# Patient Record
Sex: Female | Born: 1950 | Race: White | Hispanic: No | Marital: Married | Smoking: Former smoker
Health system: Southern US, Community
[De-identification: ages and names within clinical notes are randomized; demographics above are authoritative.]

## PROBLEM LIST (undated history)

## (undated) DIAGNOSIS — I10 Essential (primary) hypertension: Secondary | ICD-10-CM

## (undated) DIAGNOSIS — R002 Palpitations: Secondary | ICD-10-CM

## (undated) DIAGNOSIS — F419 Anxiety disorder, unspecified: Secondary | ICD-10-CM

## (undated) DIAGNOSIS — R0600 Dyspnea, unspecified: Secondary | ICD-10-CM

## (undated) DIAGNOSIS — R7303 Prediabetes: Secondary | ICD-10-CM

## (undated) DIAGNOSIS — R011 Cardiac murmur, unspecified: Secondary | ICD-10-CM

## (undated) DIAGNOSIS — T8859XA Other complications of anesthesia, initial encounter: Secondary | ICD-10-CM

## (undated) DIAGNOSIS — F32A Depression, unspecified: Secondary | ICD-10-CM

## (undated) DIAGNOSIS — E7800 Pure hypercholesterolemia, unspecified: Secondary | ICD-10-CM

## (undated) DIAGNOSIS — M1990 Unspecified osteoarthritis, unspecified site: Secondary | ICD-10-CM

## (undated) HISTORY — PX: KNEE ARTHROSCOPY: SUR90

## (undated) HISTORY — PX: CATARACT EXTRACTION: SUR2

## (undated) HISTORY — PX: ABLATION: SHX5711

---

## 1999-07-06 ENCOUNTER — Other Ambulatory Visit: Admission: RE | Admit: 1999-07-06 | Discharge: 1999-07-06 | Payer: Self-pay | Admitting: *Deleted

## 1999-08-12 ENCOUNTER — Encounter (INDEPENDENT_AMBULATORY_CARE_PROVIDER_SITE_OTHER): Payer: Self-pay | Admitting: Specialist

## 1999-08-12 ENCOUNTER — Other Ambulatory Visit: Admission: RE | Admit: 1999-08-12 | Discharge: 1999-08-12 | Payer: Self-pay | Admitting: *Deleted

## 2000-09-07 ENCOUNTER — Encounter: Payer: Self-pay | Admitting: Emergency Medicine

## 2000-09-07 ENCOUNTER — Emergency Department (HOSPITAL_COMMUNITY): Admission: EM | Admit: 2000-09-07 | Discharge: 2000-09-07 | Payer: Self-pay | Admitting: Emergency Medicine

## 2003-11-10 ENCOUNTER — Other Ambulatory Visit: Admission: RE | Admit: 2003-11-10 | Discharge: 2003-11-10 | Payer: Self-pay | Admitting: *Deleted

## 2004-12-09 ENCOUNTER — Other Ambulatory Visit: Admission: RE | Admit: 2004-12-09 | Discharge: 2004-12-09 | Payer: Self-pay | Admitting: Family Medicine

## 2005-08-28 ENCOUNTER — Emergency Department (HOSPITAL_COMMUNITY): Admission: EM | Admit: 2005-08-28 | Discharge: 2005-08-28 | Payer: Self-pay | Admitting: Emergency Medicine

## 2005-08-29 ENCOUNTER — Other Ambulatory Visit (HOSPITAL_COMMUNITY): Admission: RE | Admit: 2005-08-29 | Discharge: 2005-09-04 | Payer: Self-pay | Admitting: Psychiatry

## 2005-08-29 ENCOUNTER — Ambulatory Visit: Payer: Self-pay | Admitting: Psychiatry

## 2005-10-04 ENCOUNTER — Ambulatory Visit (HOSPITAL_COMMUNITY): Payer: Self-pay | Admitting: Psychiatry

## 2007-02-16 IMAGING — CT CT HEAD W/O CM
1 series · 16 of 30 positions shown, 20 images · IV contrast (agent unspecified)
Comparison: None.

CLINICAL DATA: Headache, depression and blurred vision. 
 HEAD CT WITHOUT CONTRAST:
TECHNIQUE: Contiguous axial images were obtained from the base of the skull through the vertex according to standard protocol without contrast.

[Series 2: head_seq 4.5 h45s st · axial · 0.43mm/px · z∈[-143,-17]mm · 16 of 32 slices shown, 20 images]
[im 2/32  brain]
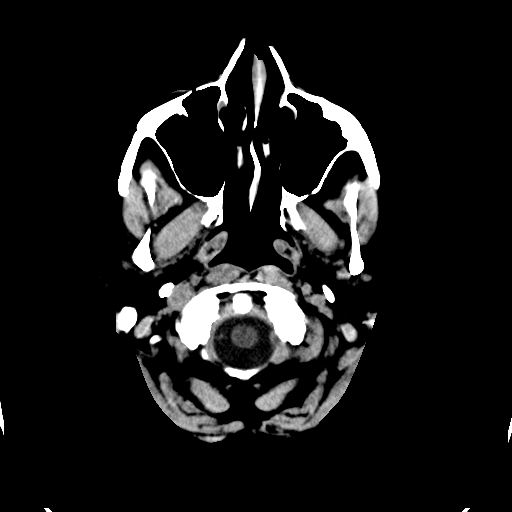
[im 2/32  bone]
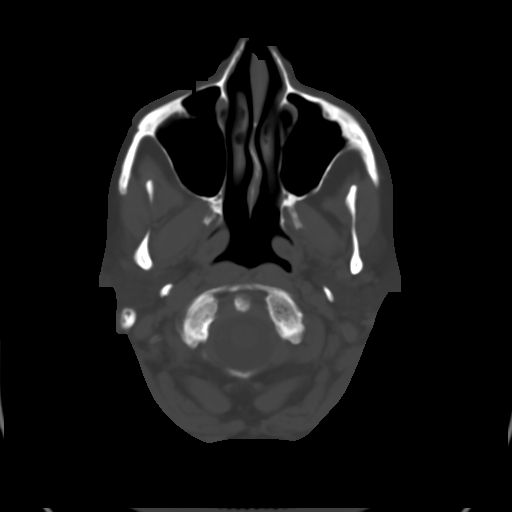
[im 4/32  brain]
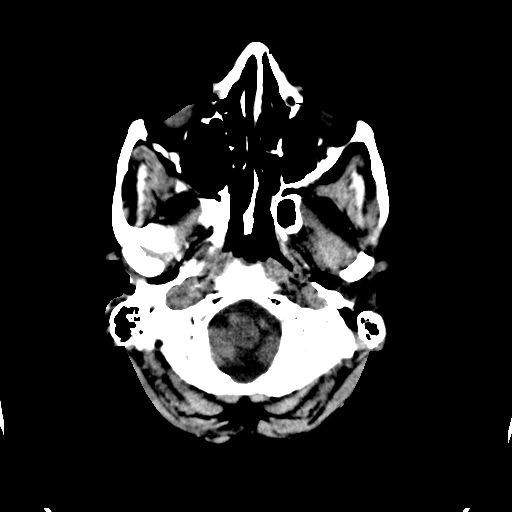
[im 6/32  brain]
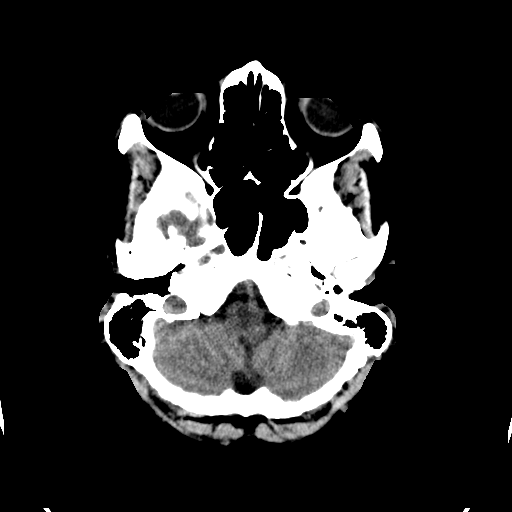
[im 8/32  brain]
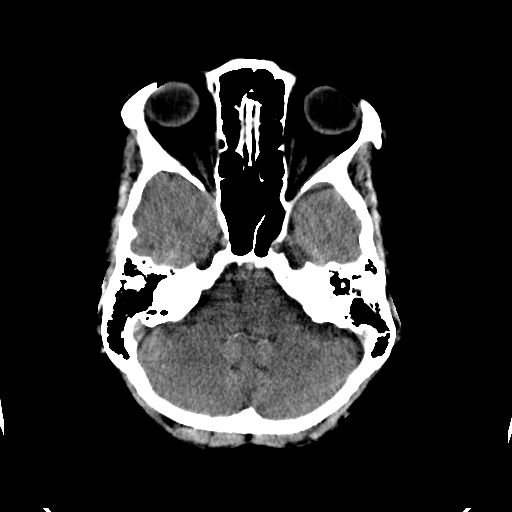
[im 9/32  brain]
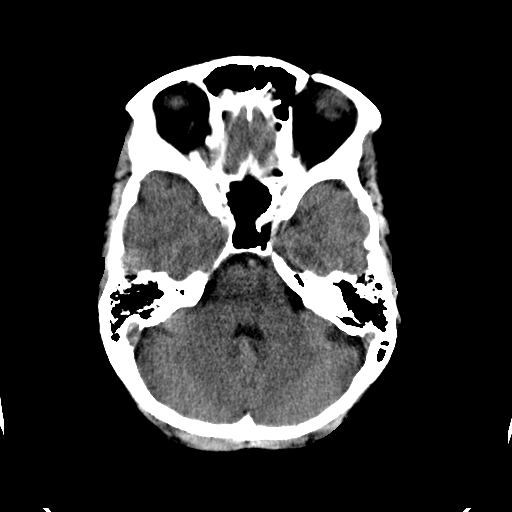
[im 9/32  bone]
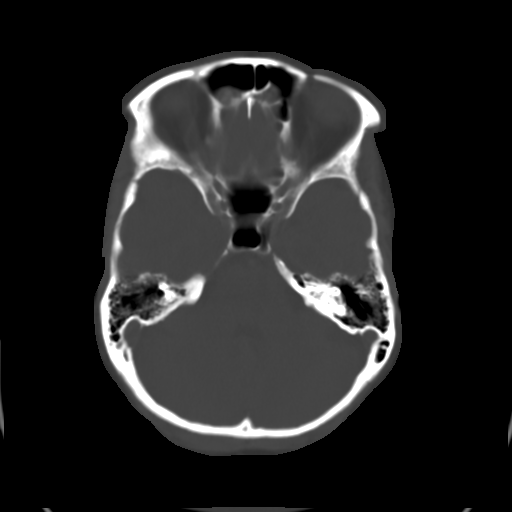
[im 11/32  brain]
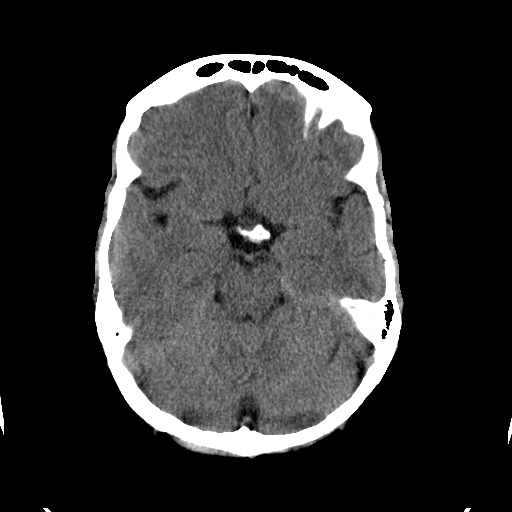
[im 13/32  brain]
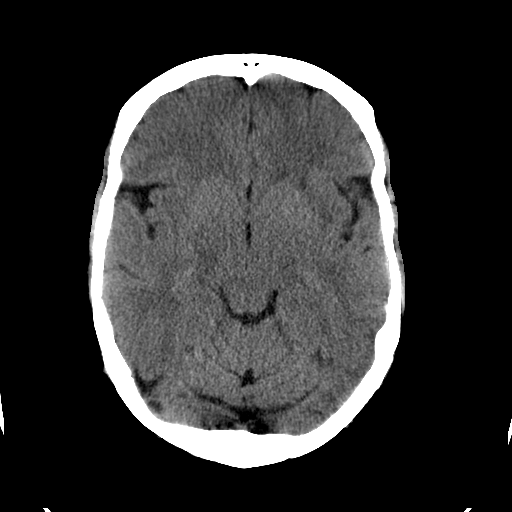
[im 15/32  brain]
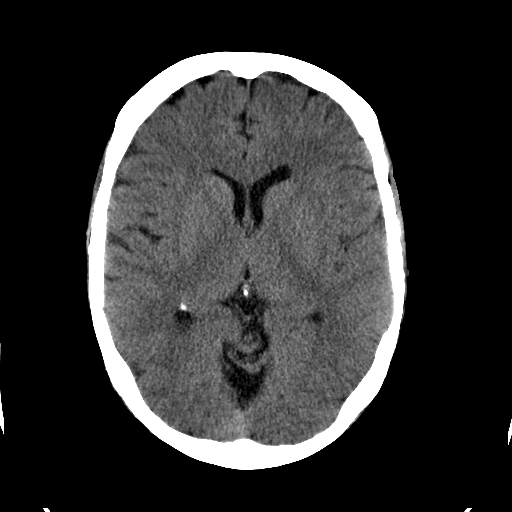
[im 17/32  brain]
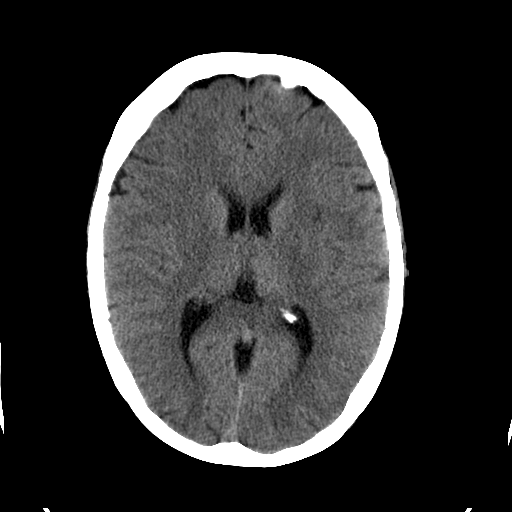
[im 17/32  bone]
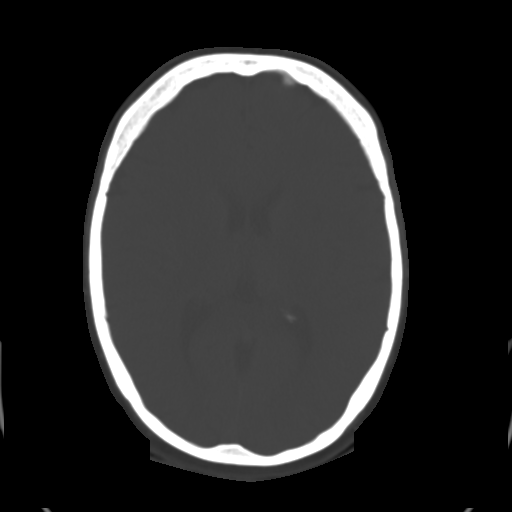
[im 19/32  brain]
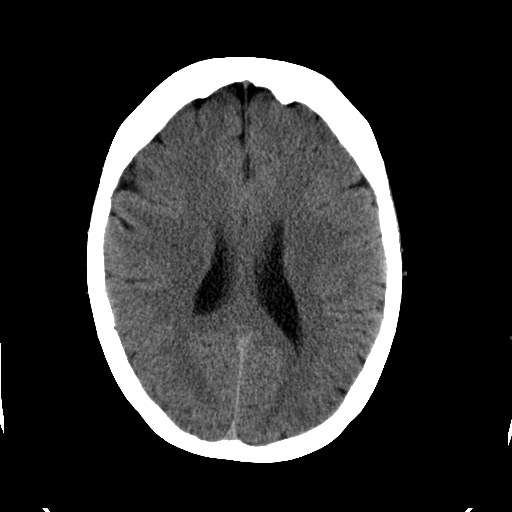
[im 21/32  brain]
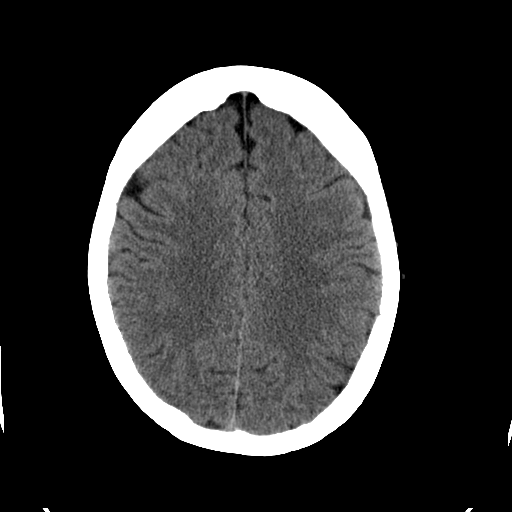
[im 23/32  brain]
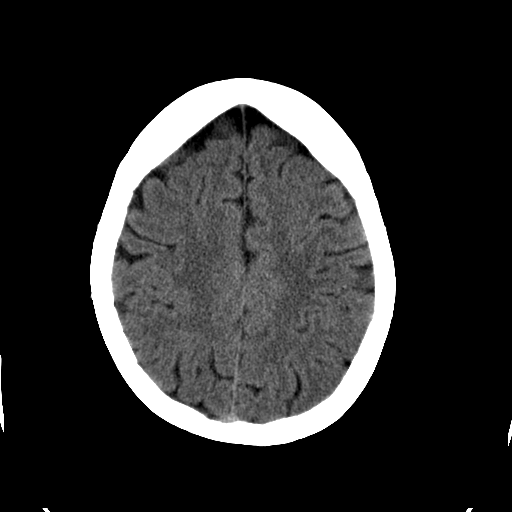
[im 24/32  brain]
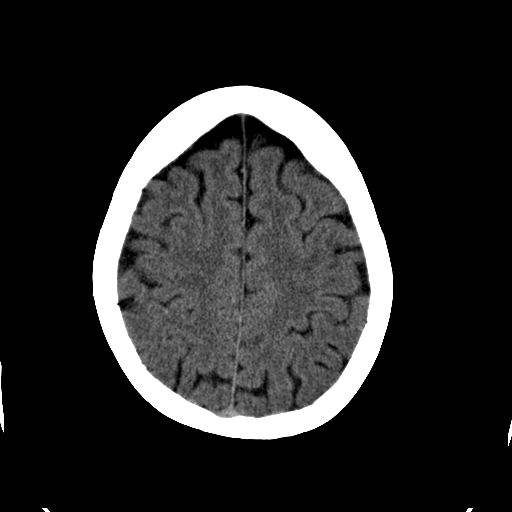
[im 24/32  bone]
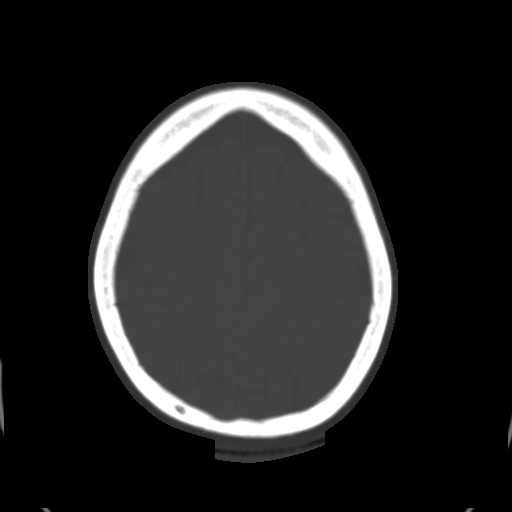
[im 26/32  brain]
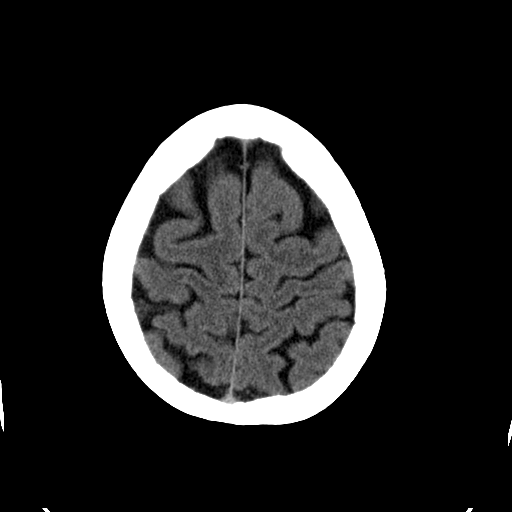
[im 28/32  brain]
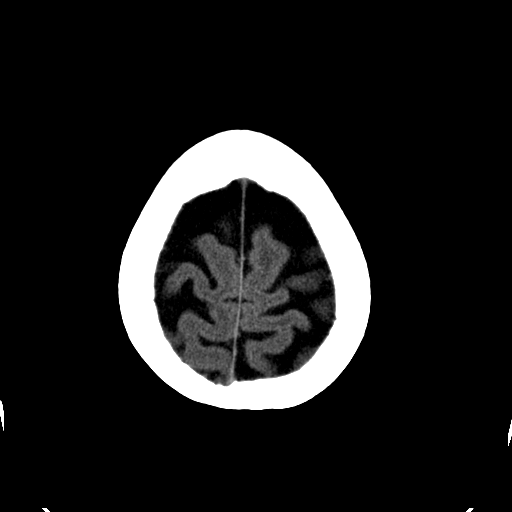
[im 30/32  brain]
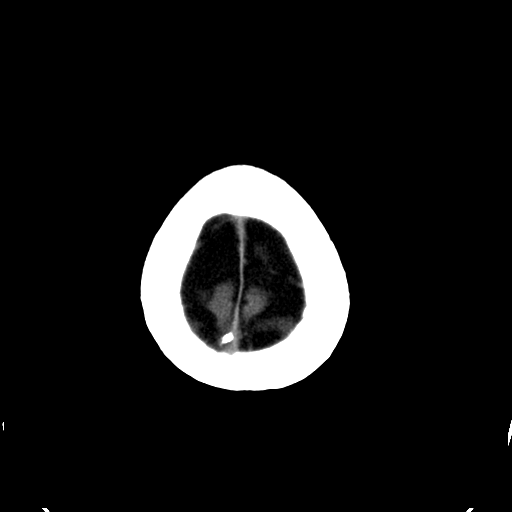

[16 of 30 positions shown; findings below may reference images not displayed]

FINDINGS: There is no evidence of acute intracranial hemorrhage, mass effect or extra-axial fluid collection.   The ventricles and subarachnoid spaces are appropriately sized for age.   The visualized paranasal sinuses are clear.  Calvarium is intact.
IMPRESSION: Unremarkable, unenhanced CT of the head.   No acute intracranial findings are seen. If the patient has persistent unexplained findings, further evaluation with MR should be considered.

## 2007-11-22 ENCOUNTER — Ambulatory Visit: Payer: Self-pay

## 2007-11-27 ENCOUNTER — Encounter: Admission: RE | Admit: 2007-11-27 | Discharge: 2007-11-27 | Payer: Self-pay | Admitting: Family Medicine

## 2009-11-04 ENCOUNTER — Encounter: Admission: RE | Admit: 2009-11-04 | Discharge: 2009-11-04 | Payer: Self-pay | Admitting: Family Medicine

## 2010-04-08 ENCOUNTER — Encounter: Admission: RE | Admit: 2010-04-08 | Discharge: 2010-04-08 | Payer: Self-pay | Admitting: Family Medicine

## 2013-05-21 ENCOUNTER — Observation Stay (HOSPITAL_COMMUNITY)
Admission: EM | Admit: 2013-05-21 | Discharge: 2013-05-22 | Disposition: A | Discharge status: Home / Self Care | Payer: BC Managed Care – PPO | Attending: Internal Medicine | Admitting: Internal Medicine

## 2013-05-21 ENCOUNTER — Encounter (HOSPITAL_COMMUNITY): Payer: Self-pay | Admitting: Emergency Medicine

## 2013-05-21 ENCOUNTER — Emergency Department (HOSPITAL_COMMUNITY): Payer: BC Managed Care – PPO

## 2013-05-21 DIAGNOSIS — R9431 Abnormal electrocardiogram [ECG] [EKG]: Secondary | ICD-10-CM | POA: Diagnosis present

## 2013-05-21 DIAGNOSIS — Z87891 Personal history of nicotine dependence: Secondary | ICD-10-CM | POA: Insufficient documentation

## 2013-05-21 DIAGNOSIS — R42 Dizziness and giddiness: Secondary | ICD-10-CM | POA: Insufficient documentation

## 2013-05-21 DIAGNOSIS — E876 Hypokalemia: Secondary | ICD-10-CM

## 2013-05-21 DIAGNOSIS — E7800 Pure hypercholesterolemia, unspecified: Secondary | ICD-10-CM | POA: Insufficient documentation

## 2013-05-21 DIAGNOSIS — R110 Nausea: Secondary | ICD-10-CM | POA: Insufficient documentation

## 2013-05-21 DIAGNOSIS — E860 Dehydration: Secondary | ICD-10-CM

## 2013-05-21 DIAGNOSIS — I10 Essential (primary) hypertension: Secondary | ICD-10-CM | POA: Diagnosis present

## 2013-05-21 DIAGNOSIS — R55 Syncope and collapse: Principal | ICD-10-CM | POA: Diagnosis present

## 2013-05-21 HISTORY — DX: Prediabetes: R73.03

## 2013-05-21 HISTORY — DX: Essential (primary) hypertension: I10

## 2013-05-21 HISTORY — DX: Pure hypercholesterolemia, unspecified: E78.00

## 2013-05-21 LAB — URINALYSIS, ROUTINE W REFLEX MICROSCOPIC
Bilirubin Urine: NEGATIVE
Ketones, ur: NEGATIVE mg/dL
Leukocytes, UA: NEGATIVE
Nitrite: NEGATIVE
Protein, ur: NEGATIVE mg/dL
Specific Gravity, Urine: 1.012 (ref 1.005–1.030)
Urobilinogen, UA: 0.2 mg/dL (ref 0.0–1.0)
pH: 6 (ref 5.0–8.0)

## 2013-05-21 LAB — COMPREHENSIVE METABOLIC PANEL
AST: 22 U/L (ref 0–37)
Albumin: 4.1 g/dL (ref 3.5–5.2)
Alkaline Phosphatase: 79 U/L (ref 39–117)
BUN: 16 mg/dL (ref 6–23)
CO2: 26 mEq/L (ref 19–32)
Creatinine, Ser: 0.79 mg/dL (ref 0.50–1.10)
GFR calc Af Amer: >90 mL/min (ref >90)
Potassium: 3.3 mEq/L — ABNORMAL LOW (ref 3.5–5.1)
Total Bilirubin: 0.3 mg/dL (ref 0.3–1.2)
Total Protein: 7.1 g/dL (ref 6.0–8.3)

## 2013-05-21 LAB — CBC WITH DIFFERENTIAL/PLATELET
Basophils Absolute: 0 10*3/uL (ref 0.0–0.1)
Basophils Relative: 0 % (ref 0–1)
Eosinophils Absolute: 0.2 10*3/uL (ref 0.0–0.7)
HCT: 39.1 % (ref 36.0–46.0)
Hemoglobin: 13.3 g/dL (ref 12.0–15.0)
Lymphocytes Relative: 15 % (ref 12–46)
MCH: 33 pg (ref 26.0–34.0)
MCHC: 34 g/dL (ref 30.0–36.0)
Monocytes Absolute: 0.9 10*3/uL (ref 0.1–1.0)
Monocytes Relative: 7 % (ref 3–12)
Neutro Abs: 9.5 10*3/uL — ABNORMAL HIGH (ref 1.7–7.7)
Neutrophils Relative %: 77 % (ref 43–77)
Platelets: 191 10*3/uL (ref 150–400)
RDW: 14 % (ref 11.5–15.5)

## 2013-05-21 LAB — GLUCOSE, CAPILLARY

## 2013-05-21 LAB — URINE MICROSCOPIC-ADD ON

## 2013-05-21 LAB — TROPONIN I: Troponin I: <0.3 ng/mL (ref <0.30)

## 2013-05-21 MED ORDER — LEVOTHYROXINE SODIUM 75 MCG PO TABS
75.0000 ug | ORAL_TABLET | Freq: Every day | ORAL | Status: DC
  Administered 2013-05-22: 75 ug via ORAL
  Filled 2013-05-21 (×2): qty 1

## 2013-05-21 MED ORDER — ENOXAPARIN SODIUM 40 MG/0.4ML ~~LOC~~ SOLN
40.0000 mg | SUBCUTANEOUS | Status: DC
  Administered 2013-05-21: 40 mg via SUBCUTANEOUS
  Filled 2013-05-21 (×2): qty 0.4

## 2013-05-21 MED ORDER — ALLOPURINOL 300 MG PO TABS
300.0000 mg | ORAL_TABLET | Freq: Every day | ORAL | Status: DC
  Administered 2013-05-22: 300 mg via ORAL
  Filled 2013-05-21: qty 1

## 2013-05-21 MED ORDER — SODIUM CHLORIDE 0.9 % IJ SOLN: 3.0000 mL | Freq: Two times a day (BID) | INTRAMUSCULAR | Status: DC

## 2013-05-21 MED ORDER — SODIUM CHLORIDE 0.9 % IV BOLUS (SEPSIS)
1000.0000 mL | Freq: Once | INTRAVENOUS | Status: AC
  Administered 2013-05-21: 1000 mL via INTRAVENOUS

## 2013-05-21 MED ORDER — ONDANSETRON HCL 4 MG/2ML IJ SOLN: 4.0000 mg | Freq: Three times a day (TID) | INTRAMUSCULAR | Status: AC | PRN

## 2013-05-21 MED ORDER — FLUOXETINE HCL 20 MG PO TABS
20.0000 mg | ORAL_TABLET | Freq: Every day | ORAL | Status: DC
  Administered 2013-05-22: 20 mg via ORAL
  Filled 2013-05-21: qty 1

## 2013-05-21 MED ORDER — SERTRALINE HCL 100 MG PO TABS
100.0000 mg | ORAL_TABLET | Freq: Every day | ORAL | Status: DC
  Administered 2013-05-22: 100 mg via ORAL
  Filled 2013-05-21: qty 1

## 2013-05-21 MED ORDER — HYDRALAZINE HCL 20 MG/ML IJ SOLN: 10.0000 mg | INTRAMUSCULAR | Status: DC | PRN

## 2013-05-21 MED ORDER — ATORVASTATIN CALCIUM 40 MG PO TABS
40.0000 mg | ORAL_TABLET | Freq: Every day | ORAL | Status: DC
  Administered 2013-05-22: 40 mg via ORAL
  Filled 2013-05-21: qty 1

## 2013-05-21 MED ORDER — NITROGLYCERIN 2 % TD OINT
0.5000 [in_us] | TOPICAL_OINTMENT | Freq: Four times a day (QID) | TRANSDERMAL | Status: DC
  Filled 2013-05-21 (×8): qty 30

## 2013-05-21 MED ORDER — ALPRAZOLAM 0.25 MG PO TABS
0.2500 mg | ORAL_TABLET | Freq: Every day | ORAL | Status: DC
  Administered 2013-05-21: 0.25 mg via ORAL
  Filled 2013-05-21: qty 1

## 2013-05-21 MED ORDER — NABUMETONE 750 MG PO TABS
750.0000 mg | ORAL_TABLET | Freq: Two times a day (BID) | ORAL | Status: DC
  Administered 2013-05-21 – 2013-05-22 (×2): 750 mg via ORAL
  Filled 2013-05-21 (×3): qty 1

## 2013-05-21 MED ORDER — SODIUM CHLORIDE 0.9 % IV SOLN
INTRAVENOUS | Status: AC
  Administered 2013-05-21: 22:00:00 via INTRAVENOUS

## 2013-05-21 NOTE — H&P (Signed)
[facility] History and Physical  Patient: Christina Villegas  NWG:[id number]  DOB: [birth year]  DOA: 05/21/2013  Referring physician: Dr. Manus Gunning PCP: No primary provider on file.  Consults:     Chief Complaint: Near syncope  HPI: Christina Villegas is a 62 y.o. female with Past medical history of hypertension. She presented today with 2 episodes of near-syncope. She mentions that she has been working they are throughout the day today when she went in the house to get some water she felt lightheaded and nausea to had to sit down. She said that she was going pass out but denies any loss of consciousness. EMS was called by which time the patient was awake with some jumbled speech. After that when EMS was bringing the patient to the hospital she had another episode of similar lightheaded and dizziness with near syncope. Patient denies any complaint of fever, chest pain, shortness of breath, headache, focal neurological deficit or diarrhea, vomiting, abdominal pain. She mentions she is compliant with all her medications and has been taking both Lasix and hydrochlorothiazide.  Review of Systems: as mentioned in the history of present illness.  A Comprehensive review of the other systems is negative.  Past Medical History  Diagnosis Date  . Hypertension   . High cholesterol   . Pre-diabetes    Past Surgical History  Procedure Laterality Date  . Knee arthroscopy      rt side   Social History:  reports that she has quit smoking. She does not have any smokeless tobacco history on file. She reports that she does not drink alcohol or use illicit drugs. Patient is coming from home. Independent for most of her  ADL.  Allergies  Allergen Reactions  . Hydrocodone Nausea Only    Family History  Problem Relation Age of Onset  . Heart failure Mother   . Hypertension Mother   . Heart failure Father   . Hypertension Father   . Heart failure Brother     Prior to Admission medications    Medication Sig Start Date End Date Taking? Authorizing Provider  allopurinol (ZYLOPRIM) 300 MG tablet Take 300 mg by mouth daily.   Yes Historical Provider, MD  ALPRAZolam (XANAX) 0.25 MG tablet Take 0.25 mg by mouth every evening.    Yes Historical Provider, MD  FLUoxetine (PROZAC) 20 MG tablet Take 20 mg by mouth daily.   Yes Historical Provider, MD  furosemide (LASIX) 20 MG tablet Take 20 mg by mouth daily.   Yes Historical Provider, MD  hydrochlorothiazide (HYDRODIURIL) 25 MG tablet Take 25 mg by mouth daily.   Yes Historical Provider, MD  levothyroxine (SYNTHROID, LEVOTHROID) 75 MCG tablet Take 75 mcg by mouth daily before breakfast.   Yes Historical Provider, MD  nabumetone (RELAFEN) 750 MG tablet Take 750 mg by mouth 2 (two) times daily.   Yes Historical Provider, MD  sertraline (ZOLOFT) 100 MG tablet Take 100 mg by mouth every morning.   Yes Historical Provider, MD  simvastatin (ZOCOR) 80 MG tablet Take 80 mg by mouth daily.   Yes Historical Provider, MD  Vitamin D, Ergocalciferol, (DRISDOL) 50000 UNITS CAPS capsule Take 50,000 Units by mouth every 7 (seven) days. On mondays   Yes Historical Provider, MD    Physical Exam: Filed Vitals:   05/21/13 1755 05/21/13 1815 05/21/13 2000 05/21/13 2103  BP: 169/84 178/88 170/72 168/68  Pulse: 88 86 97   Temp:      TempSrc:      Resp:  20 20  18  SpO2:  100% 98% 98%    General: Alert, Awake and Oriented to Time, Place and Person. Appear in mild distress Eyes: PERRL ENT: Oral Mucosa clear moist. Neck: no JVD, no Carotid Bruits  Cardiovascular: S1 and S2 Present, no Murmur, Peripheral Pulses Present Respiratory: Bilateral Air entry equal and Decreased, Clear to Auscultation,  no Crackles,no wheezes Abdomen: Bowel Sound Present, Soft and Non tender Skin: no Rash Extremities: no Pedal edema, no calf tenderness Neurologic: Grossly Unremarkable.  Labs on Admission:  CBC:  Recent Labs Lab 05/21/13 1920  WBC 12.4*  NEUTROABS 9.5*   HGB 13.3  HCT 39.1  MCV 97.0  PLT 191    CMP     Component Value Date/Time   NA 138 05/21/2013 1920   K 3.3* 05/21/2013 1920   CL 100 05/21/2013 1920   CO2 26 05/21/2013 1920   GLUCOSE 108* 05/21/2013 1920   BUN 16 05/21/2013 1920   CREATININE 0.79 05/21/2013 1920   CALCIUM 9.8 05/21/2013 1920   PROT 7.1 05/21/2013 1920   ALBUMIN 4.1 05/21/2013 1920   AST 22 05/21/2013 1920   ALT 16 05/21/2013 1920   ALKPHOS 79 05/21/2013 1920   BILITOT 0.3 05/21/2013 1920   GFRNONAA 87* 05/21/2013 1920   GFRAA >90 05/21/2013 1920    No results found for this basename: LIPASE, AMYLASE,  in the last 168 hours No results found for this basename: AMMONIA,  in the last 168 hours  Cardiac Enzymes:  Recent Labs Lab 05/21/13 1920  TROPONINI <0.30    BNP (last 3 results) No results found for this basename: PROBNP,  in the last 8760 hours  Radiological Exams on Admission: Dg Chest 2 View  05/21/2013   CLINICAL DATA:  Syncope  EXAM: CHEST  2 VIEW  COMPARISON:  Prior radiograph from 05/10/2012  FINDINGS: There is mild accentuation of the transverse heart size, likely secondary to the AP technique. Mediastinal silhouette is unchanged.  Lungs are normally inflated. No focal infiltrate to suggest acute infectious pneumonitis is identified. There is no pulmonary edema or pleural effusion. No pneumothorax.  Multilevel degenerative osteophytosis noted within the visualized spine. No acute osseous abnormality.  IMPRESSION: No active cardiopulmonary disease.   Electronically Signed   By: Rise Mu M.D.   On: 05/21/2013 18:49    EKG: Independently reviewed. normal sinus rhythm, nonspecific ST and T waves changes.  Assessment/Plan Principal Problem:   Syncope Active Problems:   EKG abnormalities   Hypertension   1. Syncope The patient had poor oral intake and then had done yard work during the day after which she felt lightheaded. She has been taking diuretics 2 of them consistently. Possible  etiologies include dehydration versus vasovagal. With the recurrent symptoms patient will be admitted for observation. Telemetry and echocardiogram will be obtained.  2. Inferolateral EKG changes The patient's initial EKG has T wave inversions in lead 3 and aVF. Although we do not have any prior EKG to compare changes. Her initial troponin is negative and she does not have any chest pain. We'll follow serial troponins.  3. Hypertension At present we will hold off her Lasix and hydrochlorothiazide   DVT Prophylaxis: subcutaneous Heparin Nutrition: Cardiac diet  Code Status: Full  Family Communication: Husband was present at bedside, opportunity was given to the family to ask question and all questions were answered satisfactorily at the time of interview. Disposition: Admitted to observation in telemetry.  Author: Lynden Oxford, MD [facility] Pager: [phone] 05/21/2013, 9:09 PM  If 7PM-7AM, please contact night-coverage [email or web address] Password [email or web address]

## 2013-05-21 NOTE — ED Provider Notes (Signed)
CSN: [id number]     Arrival date & time 05/21/13  1715 History   First MD Initiated Contact with Patient 05/21/13 1719     Chief Complaint  Patient presents with  . Loss of Consciousness   (Consider location/radiation/quality/duration/timing/severity/associated sxs/prior Treatment) HPI Comments: Syncopal episode after working in the yard today. EMS reports patient began to feel lightheaded, nauseated after she finished working in the yard and had to sit down on a lawnmower. She then had blurry vision and "everything went white" and she loss consciousness. On EMS arrival patient another syncopal episode when she was sat up and had jumbled speech for about 10 seconds and back to normal. Patient is to poor by mouth intake since this morning. Significant it was nonexertional. No chest pain or shortness of breath. Denies any previous history of cardiac problems. Did not hit head.  The history is provided by the EMS personnel and the patient.    Past Medical History  Diagnosis Date  . Hypertension   . High cholesterol   . Pre-diabetes    Past Surgical History  Procedure Laterality Date  . Knee arthroscopy      rt side   Family History  Problem Relation Age of Onset  . Heart failure Mother   . Hypertension Mother   . Heart failure Father   . Hypertension Father   . Heart failure Brother    History  Substance Use Topics  . Smoking status: Former Games developer  . Smokeless tobacco: Not on file  . Alcohol Use: No   OB History   Grav Para Term Preterm Abortions TAB SAB Ect Mult Living                 Review of Systems  Constitutional: Negative for fever, activity change, appetite change and fatigue.  Respiratory: Negative for cough, chest tightness and shortness of breath.   Cardiovascular: Positive for syncope. Negative for chest pain.  Gastrointestinal: Negative for nausea, vomiting and abdominal pain.  Genitourinary: Negative for dysuria and hematuria.  Musculoskeletal: Negative for  arthralgias and back pain.  Skin: Negative for rash.  Neurological: Positive for dizziness, syncope and light-headedness. Negative for headaches.  A complete 10 system review of systems was obtained and all systems are negative except as noted in the HPI and PMH.    Allergies  Hydrocodone  Home Medications  No current outpatient prescriptions on file. BP 146/51  Pulse 91  Temp(Src) 98.1 F (36.7 C) (Oral)  Resp 16  Ht 5\' 3"  (1.6 m)  Wt 210 lb 12.8 oz (95.618 kg)  BMI 37.35 kg/m2  SpO2 98% Physical Exam  Constitutional: She is oriented to person, place, and time. She appears well-developed and well-nourished. No distress.  Flat affect  HENT:  Head: Normocephalic and atraumatic.  Mouth/Throat: Oropharynx is clear and moist. No oropharyngeal exudate.  Eyes: Conjunctivae and EOM are normal. Pupils are equal, round, and reactive to light.  Neck: Normal range of motion. Neck supple.  Cardiovascular: Normal rate, regular rhythm and normal heart sounds.   No murmur heard. Pulmonary/Chest: Effort normal and breath sounds normal. No respiratory distress.  Abdominal: Soft. There is no tenderness. There is no rebound and no guarding.  Musculoskeletal: Normal range of motion. She exhibits no edema and no tenderness.  Neurological: She is alert and oriented to person, place, and time. No cranial nerve deficit. She exhibits normal muscle tone. Coordination normal.  CN 2-12 intact, no ataxia on finger to nose, no nystagmus, 5/5 strength throughout, no  pronator drift, Romberg negative, normal gait.   Skin: Skin is warm.    ED Course  Procedures (including critical care time) Labs Review Labs Reviewed  CBC WITH DIFFERENTIAL - Abnormal; Notable for the following:    WBC 12.4 (*)    Neutro Abs 9.5 (*)    All other components within normal limits  COMPREHENSIVE METABOLIC PANEL - Abnormal; Notable for the following:    Potassium 3.3 (*)    Glucose, Bld 108 (*)    GFR calc non Af Amer 87  (*)    All other components within normal limits  URINALYSIS, ROUTINE W REFLEX MICROSCOPIC - Abnormal; Notable for the following:    Hgb urine dipstick SMALL (*)    All other components within normal limits  GLUCOSE, CAPILLARY - Abnormal; Notable for the following:    Glucose-Capillary 106 (*)    All other components within normal limits  URINE MICROSCOPIC-ADD ON - Abnormal; Notable for the following:    Bacteria, UA FEW (*)    Casts GRANULAR CAST (*)    All other components within normal limits  TROPONIN I  TROPONIN I  TROPONIN I  TROPONIN I  COMPREHENSIVE METABOLIC PANEL  CBC  PROTIME-INR   Imaging Review Dg Chest 2 View  05/21/2013   CLINICAL DATA:  Syncope  EXAM: CHEST  2 VIEW  COMPARISON:  Prior radiograph from 05/10/2012  FINDINGS: There is mild accentuation of the transverse heart size, likely secondary to the AP technique. Mediastinal silhouette is unchanged.  Lungs are normally inflated. No focal infiltrate to suggest acute infectious pneumonitis is identified. There is no pulmonary edema or pleural effusion. No pneumothorax.  Multilevel degenerative osteophytosis noted within the visualized spine. No acute osseous abnormality.  IMPRESSION: No active cardiopulmonary disease.   Electronically Signed   By: Rise Mu M.D.   On: 05/21/2013 18:49    EKG Interpretation     Ventricular Rate:  85 PR Interval:  167 QRS Duration: 91 QT Interval:  378 QTC Calculation: 449 R Axis:   41 Text Interpretation:  Sinus rhythm Biatrial enlargement Inferior infarct, age indeterminate Lateral leads are also involved inferolateral ST depressions,  No previous ECGs available            MDM   1. Syncope   2. EKG abnormality    Syncopal episode preceded by lightheadedness, nausea, blurry vision.  No CP or SOB.  Feels back to baseline now.  Poor po intake today.  Syncope nonexertional.  No murmurs.  Syncope sounds vasovagal.  Some ST depressions on EKG, no comparison.   Labs remarkable for leukocytosis.  No evidence of infection.  Given EKG abnormalities, will monitor overnight and workup syncope.     Glynn Octave, MD 05/21/13 [phone]

## 2013-05-21 NOTE — ED Notes (Addendum)
Patient transported to X-ray 

## 2013-05-21 NOTE — ED Notes (Addendum)
Per EMS: Pt picked up from home. Reported pt was working in yard today. LOC. Pt reports she "blacked out." EMS arrived pt A&Ox4 then suddenly "blacked out" again for 10-15secs, laid back and came to and spoke with "jumbled" words lasting 10 secs then back to norm. Not much to eat, crackers and Gatorade. H/o syncopal episode after a period of "laughing hard." VSS, NAD.
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
## 2013-05-22 DIAGNOSIS — E860 Dehydration: Secondary | ICD-10-CM

## 2013-05-22 DIAGNOSIS — I10 Essential (primary) hypertension: Secondary | ICD-10-CM

## 2013-05-22 DIAGNOSIS — E876 Hypokalemia: Secondary | ICD-10-CM

## 2013-05-22 LAB — COMPREHENSIVE METABOLIC PANEL
ALT: 14 U/L (ref 0–35)
AST: 17 U/L (ref 0–37)
Albumin: 3.5 g/dL (ref 3.5–5.2)
BUN: 15 mg/dL (ref 6–23)
CO2: 29 mEq/L (ref 19–32)
Calcium: 8.9 mg/dL (ref 8.4–10.5)
Chloride: 104 mEq/L (ref 96–112)
Glucose, Bld: 103 mg/dL — ABNORMAL HIGH (ref 70–99)
Potassium: 3.2 mEq/L — ABNORMAL LOW (ref 3.5–5.1)
Total Protein: 6 g/dL (ref 6.0–8.3)

## 2013-05-22 LAB — TROPONIN I
Troponin I: <0.3 ng/mL (ref <0.30)
Troponin I: <0.3 ng/mL (ref <0.30)

## 2013-05-22 LAB — PROTIME-INR: INR: 1.12 (ref 0.00–1.49)

## 2013-05-22 LAB — CBC
HCT: 36.8 % (ref 36.0–46.0)
Hemoglobin: 12.2 g/dL (ref 12.0–15.0)
MCH: 32.4 pg (ref 26.0–34.0)
MCHC: 33.2 g/dL (ref 30.0–36.0)
MCV: 97.9 fL (ref 78.0–100.0)
RBC: 3.76 MIL/uL — ABNORMAL LOW (ref 3.87–5.11)

## 2013-05-22 MED ORDER — POTASSIUM CHLORIDE CRYS ER 20 MEQ PO TBCR
40.0000 meq | EXTENDED_RELEASE_TABLET | Freq: Once | ORAL | Status: AC
  Administered 2013-05-22: 40 meq via ORAL
  Filled 2013-05-22: qty 2

## 2013-05-22 MED ORDER — LISINOPRIL 10 MG PO TABS: 10.0000 mg | ORAL_TABLET | Freq: Every day | ORAL | Status: DC

## 2013-05-22 NOTE — Progress Notes (Signed)
Pt provided with dc isntructions and education. Pt verbalized understanding. Pt has no questions at thist ime. Aware of medications that have been stopped. IV removed with tip intact. Heart monitor cleaned and returned to front. Levonne Spiller, RN

## 2013-05-22 NOTE — Progress Notes (Signed)
05-22-13 1222 MD spoke to CM in regards to assisting pt to get a BP cuff. Pt is unable to pay out of pocket due to husband is the only one working at this time. CM did call [facility] and they have an automatic cuff for 29.99. Case Manager did call [employer] to see if pt could get a BP cuff and pt was not enrolled in the CAD Program. [employer] will call pt to see if she qualifies for program tomorrow. CM did go to [facility] and spoke to [facility] and he provided CM 40.00 in cash to give to pt to get a BP cuff at [facility] on [address] Money was given to pt and family was very Adult nurse. CM did call [facility] to make them aware that pt has the money to get the cuff. No further needs from CM at this time. Gala Lewandowsky, RN,BSN [phone]

## 2013-05-22 NOTE — Progress Notes (Signed)
UR COMPLETED  

## 2013-05-22 NOTE — Discharge Summary (Signed)
Physician Discharge Summary  Christina Villegas ZOX:[id number] DOB: 10-Apr-1951 DOA: 05/21/2013  PCP: No primary provider on file.  Admit date: 05/21/2013 Discharge date: 05/22/2013  Time spent: 35 minutes  Recommendations for Outpatient Follow-up:  1. Please follow up on patient's blood pressures, stopping hydrochlorothiazide and Lasix do to dehydration. Started lisinopril 10 mg by mouth daily.  Discharge Diagnoses:  Principal Problem:   Syncope Active Problems:   EKG abnormalities   Hypertension   Discharge Condition: Stable/improved  Diet recommendation: Heart healthy diet  Filed Weights   05/21/13 2148  Weight: 95.618 kg (210 lb 12.8 oz)    History of present illness:  Christina Villegas is a 62 y.o. female with Past medical history of hypertension.  She presented today with 2 episodes of near-syncope. She mentions that she has been working they are throughout the day today when she went in the house to get some water she felt lightheaded and nausea to had to sit down. She said that she was going pass out but denies any loss of consciousness. EMS was called by which time the patient was awake with some jumbled speech. After that when EMS was bringing the patient to the hospital she had another episode of similar lightheaded and dizziness with near syncope.  Patient denies any complaint of fever, chest pain, shortness of breath, headache, focal neurological deficit or diarrhea, vomiting, abdominal pain.  She mentions she is compliant with all her medications and has been taking both Lasix and hydrochlorothiazide.   Hospital Course:  Patient is a pleasant 62 year old female with a past medical history of hypertension, who had been on Hydrochlorothiazide and Lasix. She reported working in her garden the day before admission, feeling dizzy and lightheaded. She reported 2 episodes of near syncope. She was placed on telemetry for overnight observation. Troponin remained negative x4 sets. Lab work  showed the presence of hypokalemia with a potassium of 3.2 for which she was given 40 mEq by mouth of potassium chloride. Symptoms likely secondary to hypovolemia/dehydration likely resulting from diuretic therapy. By the following day, she reported feeling much better, ambulated down the hallway and back. Orthostatics were checked which were negative. She was instructed to stop diuretics, starting lisinopril at 10 mg by mouth daily. She was also instructed to check her blood pressures daily, record them on a log book and present these to her primary care provider on hospital followup.  Discharge Exam: Filed Vitals:   05/22/13 0846  BP: 155/74  Pulse: 77  Temp: 97.5 F (36.4 C)  Resp: 18    General: No acute distress, reports feeling better today. No further episodes of syncope or near-syncope Cardiovascular: Regular rate and rhythm normal S1-S2 Respiratory: Lungs are clear to auscultation bilaterally no wheezing rhonchi or rales Abdomen: Soft nontender nondistended positive bowel sounds Extremities: No edema  Discharge Instructions  Discharge Orders   Future Orders Complete By Expires   Diet - low sodium heart healthy  As directed    Discharge instructions  As directed    Comments:     1) We are stopping your fluid pills and starting a new blood pressure medication. Please check your blood pressures once daily, record these values on a log book and show this to your family doctor on follow up. 2) Please follow up with your family doctor in 1 week   Increase activity slowly  As directed        Medication List    STOP taking these medications  furosemide 20 MG tablet  Commonly known as:  LASIX     hydrochlorothiazide 25 MG tablet  Commonly known as:  HYDRODIURIL      TAKE these medications       allopurinol 300 MG tablet  Commonly known as:  ZYLOPRIM  Take 300 mg by mouth daily.     ALPRAZolam 0.25 MG tablet  Commonly known as:  XANAX  Take 0.25 mg by mouth every  evening.     FLUoxetine 20 MG tablet  Commonly known as:  PROZAC  Take 20 mg by mouth daily.     levothyroxine 75 MCG tablet  Commonly known as:  SYNTHROID, LEVOTHROID  Take 75 mcg by mouth daily before breakfast.     lisinopril 10 MG tablet  Commonly known as:  PRINIVIL  Take 1 tablet (10 mg total) by mouth daily.     nabumetone 750 MG tablet  Commonly known as:  RELAFEN  Take 750 mg by mouth 2 (two) times daily.     sertraline 100 MG tablet  Commonly known as:  ZOLOFT  Take 100 mg by mouth every morning.     simvastatin 80 MG tablet  Commonly known as:  ZOCOR  Take 80 mg by mouth daily.     Vitamin D (Ergocalciferol) 50000 UNITS Caps capsule  Commonly known as:  DRISDOL  Take 50,000 Units by mouth every 7 (seven) days. On mondays       Allergies  Allergen Reactions  . Hydrocodone Nausea Only      The results of significant diagnostics from this hospitalization (including imaging, microbiology, ancillary and laboratory) are listed below for reference.    Significant Diagnostic Studies: Dg Chest 2 View  05/21/2013   CLINICAL DATA:  Syncope  EXAM: CHEST  2 VIEW  COMPARISON:  Prior radiograph from 05/10/2012  FINDINGS: There is mild accentuation of the transverse heart size, likely secondary to the AP technique. Mediastinal silhouette is unchanged.  Lungs are normally inflated. No focal infiltrate to suggest acute infectious pneumonitis is identified. There is no pulmonary edema or pleural effusion. No pneumothorax.  Multilevel degenerative osteophytosis noted within the visualized spine. No acute osseous abnormality.  IMPRESSION: No active cardiopulmonary disease.   Electronically Signed   By: Rise Mu M.D.   On: 05/21/2013 18:49    Microbiology: No results found for this or any previous visit (from the past 240 hour(s)).   Labs: Basic Metabolic Panel:  Recent Labs Lab 05/21/13 1920 05/22/13 0110  NA 138 140  K 3.3* 3.2*  CL 100 104  CO2 26 29   GLUCOSE 108* 103*  BUN 16 15  CREATININE 0.79 0.79  CALCIUM 9.8 8.9   Liver Function Tests:  Recent Labs Lab 05/21/13 1920 05/22/13 0110  AST 22 17  ALT 16 14  ALKPHOS 79 69  BILITOT 0.3 0.4  PROT 7.1 6.0  ALBUMIN 4.1 3.5   No results found for this basename: LIPASE, AMYLASE,  in the last 168 hours No results found for this basename: AMMONIA,  in the last 168 hours CBC:  Recent Labs Lab 05/21/13 1920 05/22/13 0110  WBC 12.4* 10.1  NEUTROABS 9.5*  --   HGB 13.3 12.2  HCT 39.1 36.8  MCV 97.0 97.9  PLT 191 182   Cardiac Enzymes:  Recent Labs Lab 05/21/13 1920 05/22/13 0110 05/22/13 0710 05/22/13 0935  TROPONINI <0.30 <0.30 <0.30 <0.30   BNP: BNP (last 3 results) No results found for this basename: PROBNP,  in the last  8760 hours CBG:  Recent Labs Lab 05/21/13 1753  GLUCAP 106*       Signed:  Misti Towle  [facility] 05/22/2013, 11:54 AM

## 2015-05-08 IMAGING — CR DG CHEST 2V
2 series · 2 of 2 positions shown · non-contrast
Comparison: Prior radiograph from 05/10/2012

CLINICAL DATA: Syncope

EXAM:
CHEST  2 VIEW

[w chest lat]
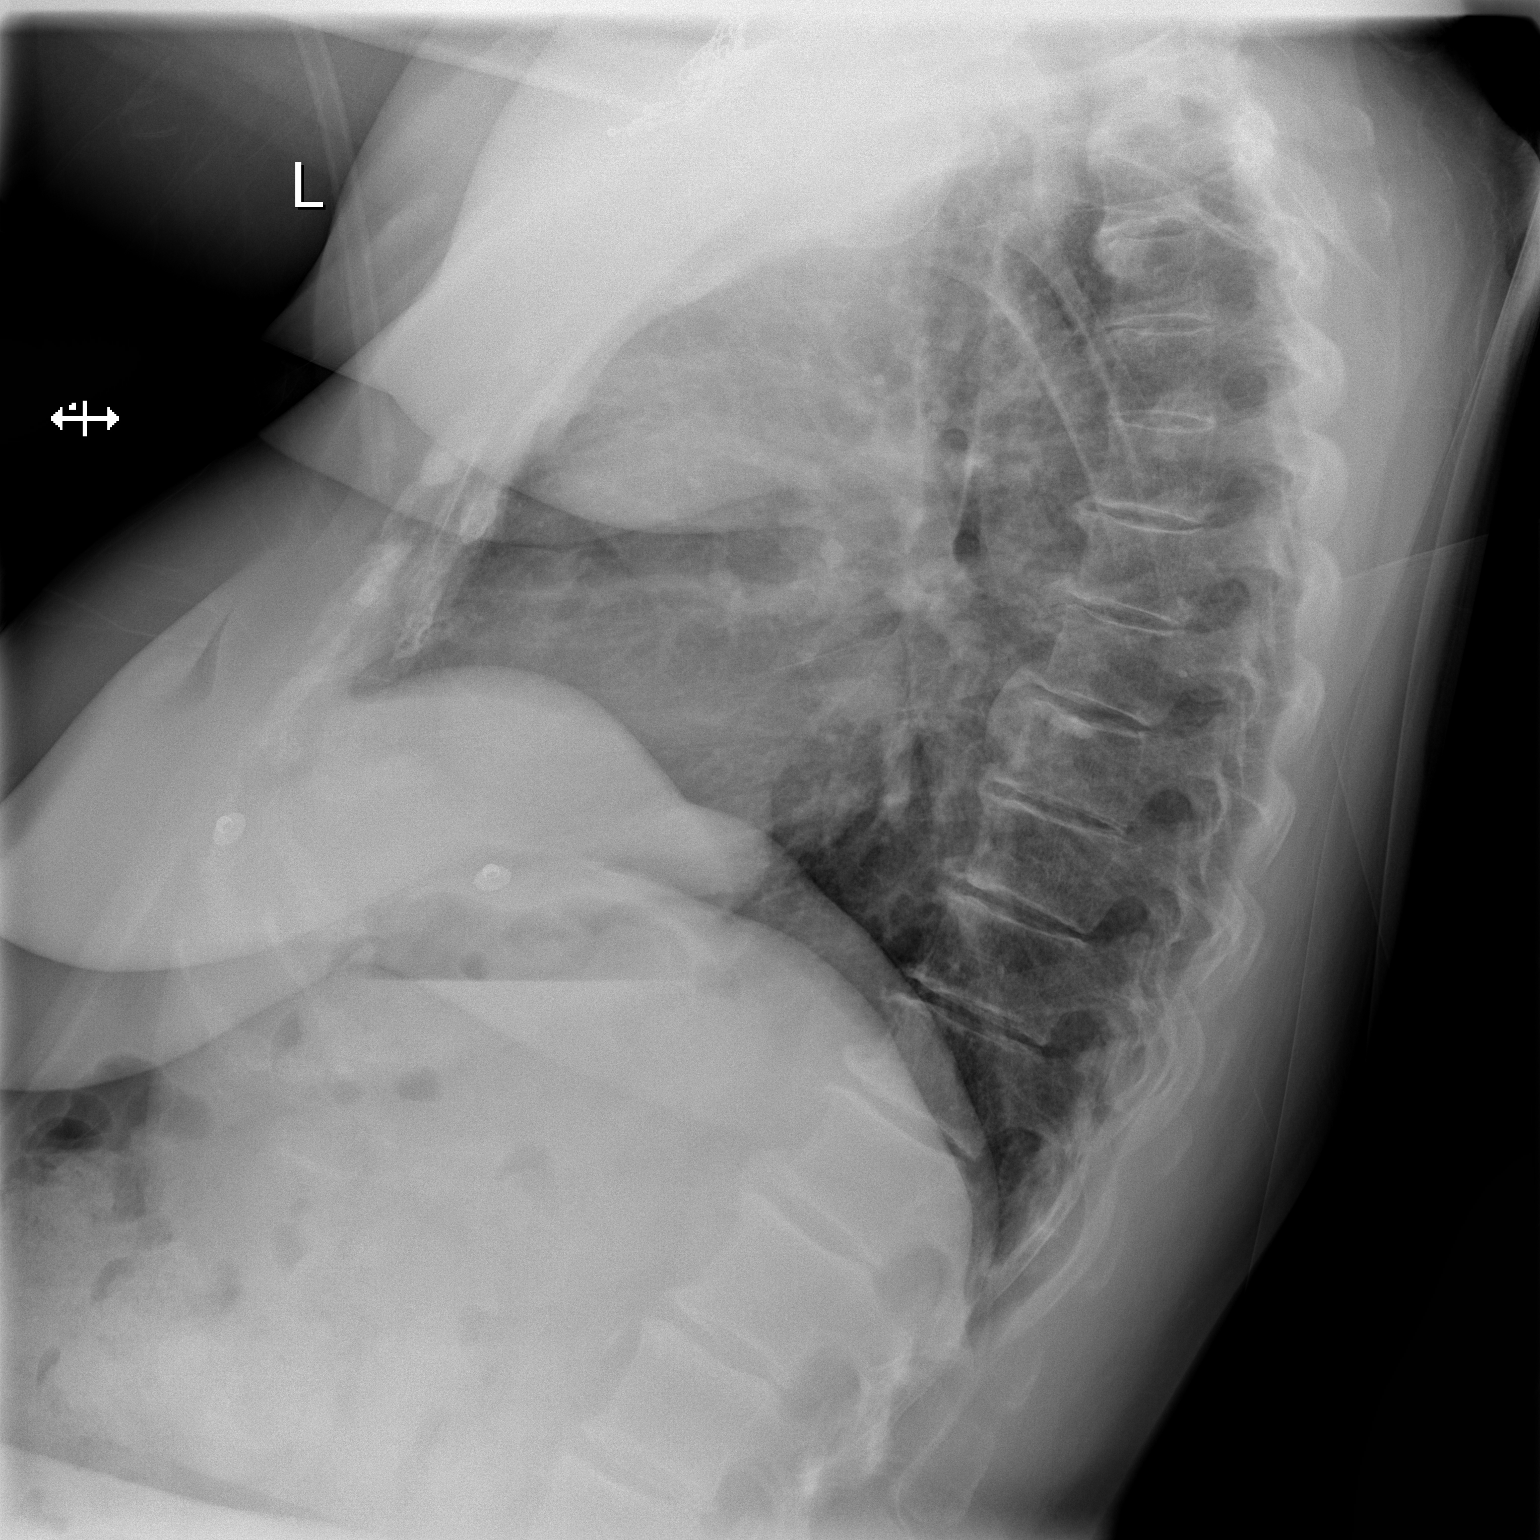

[x chest ap]
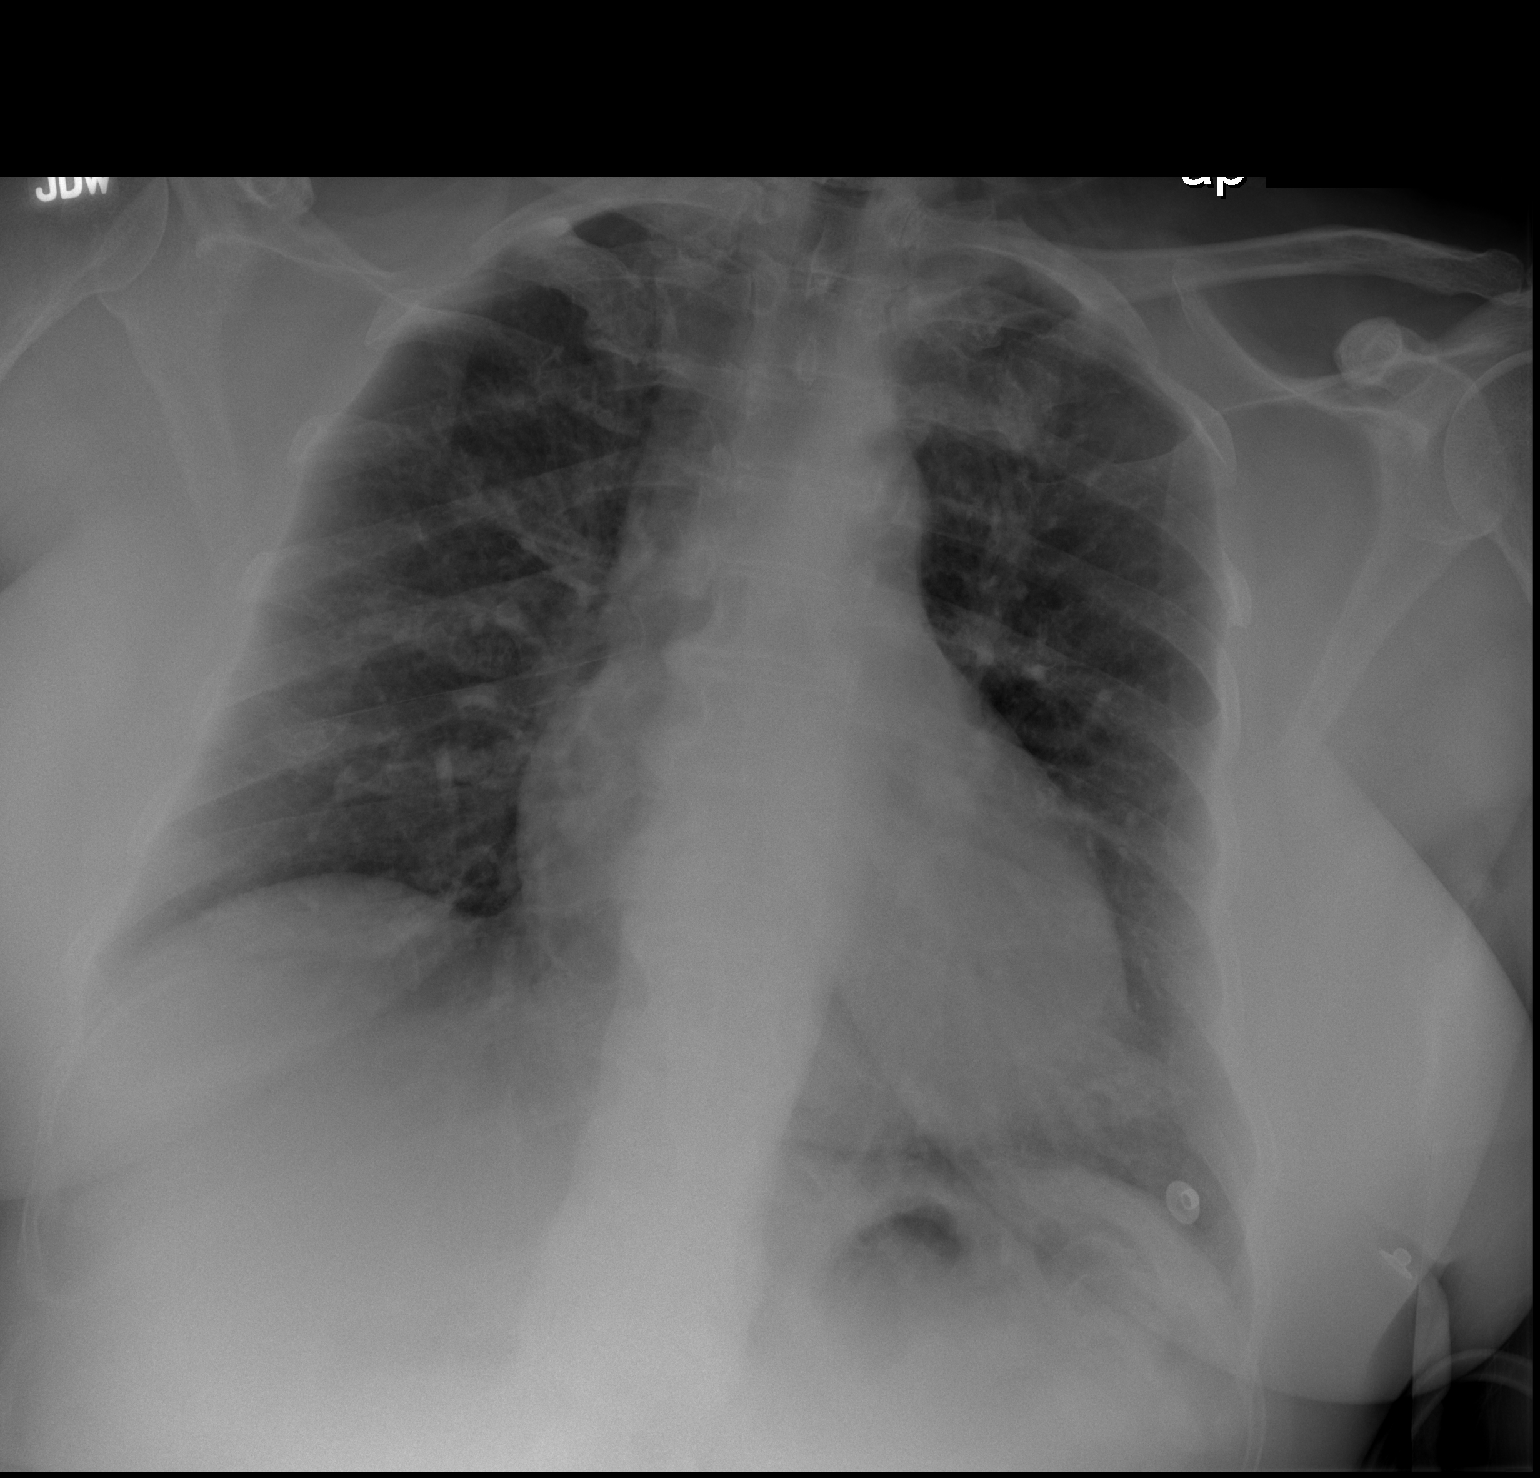

[2 of 2 positions shown; findings below may reference images not displayed]

FINDINGS: There is mild accentuation of the transverse heart size, likely
secondary to the AP technique. Mediastinal silhouette is unchanged.

Lungs are normally inflated. No focal infiltrate to suggest acute
infectious pneumonitis is identified. There is no pulmonary edema or
pleural effusion. No pneumothorax.

Multilevel degenerative osteophytosis noted within the visualized
spine. No acute osseous abnormality.
IMPRESSION: No active cardiopulmonary disease.

## 2020-05-24 ENCOUNTER — Telehealth: Payer: Self-pay | Admitting: Oncology

## 2020-05-24 NOTE — Telephone Encounter (Signed)
Scheduled appointments per 11/3 new patient referral. Attempted to call patient using both numbers provided. No answer on either number. Was unable to leave a voicemail. Will have updated calendar sent to patient with appointment date and time.

## 2020-05-25 ENCOUNTER — Telehealth: Payer: Self-pay | Admitting: Oncology

## 2020-05-25 NOTE — Telephone Encounter (Signed)
Patient and friend, Christina Villegas called to check when she is scheduled to see a Provider here.  Gave patient Appointment Information with Date, Time and Instructions

## 2020-05-31 ENCOUNTER — Other Ambulatory Visit: Payer: Self-pay

## 2020-05-31 ENCOUNTER — Inpatient Hospital Stay: Payer: Medicare HMO | Admitting: Oncology

## 2020-05-31 ENCOUNTER — Inpatient Hospital Stay: Payer: Medicare HMO | Attending: Oncology

## 2020-07-21 DIAGNOSIS — R0981 Nasal congestion: Secondary | ICD-10-CM | POA: Diagnosis not present

## 2020-07-21 DIAGNOSIS — R059 Cough, unspecified: Secondary | ICD-10-CM | POA: Diagnosis not present

## 2020-07-21 DIAGNOSIS — J3089 Other allergic rhinitis: Secondary | ICD-10-CM | POA: Diagnosis not present

## 2020-07-21 DIAGNOSIS — R519 Headache, unspecified: Secondary | ICD-10-CM | POA: Diagnosis not present

## 2020-07-21 DIAGNOSIS — Z03818 Encounter for observation for suspected exposure to other biological agents ruled out: Secondary | ICD-10-CM | POA: Diagnosis not present

## 2020-07-21 DIAGNOSIS — I10 Essential (primary) hypertension: Secondary | ICD-10-CM | POA: Diagnosis not present

## 2020-07-21 DIAGNOSIS — E119 Type 2 diabetes mellitus without complications: Secondary | ICD-10-CM | POA: Diagnosis not present

## 2020-08-04 DIAGNOSIS — R059 Cough, unspecified: Secondary | ICD-10-CM | POA: Diagnosis not present

## 2020-08-04 DIAGNOSIS — R519 Headache, unspecified: Secondary | ICD-10-CM | POA: Diagnosis not present

## 2020-08-04 DIAGNOSIS — Z03818 Encounter for observation for suspected exposure to other biological agents ruled out: Secondary | ICD-10-CM | POA: Diagnosis not present

## 2020-08-04 DIAGNOSIS — R5383 Other fatigue: Secondary | ICD-10-CM | POA: Diagnosis not present

## 2020-12-07 DIAGNOSIS — R29898 Other symptoms and signs involving the musculoskeletal system: Secondary | ICD-10-CM | POA: Diagnosis not present

## 2020-12-07 DIAGNOSIS — E559 Vitamin D deficiency, unspecified: Secondary | ICD-10-CM | POA: Diagnosis not present

## 2020-12-07 DIAGNOSIS — I10 Essential (primary) hypertension: Secondary | ICD-10-CM | POA: Diagnosis not present

## 2020-12-07 DIAGNOSIS — E039 Hypothyroidism, unspecified: Secondary | ICD-10-CM | POA: Diagnosis not present

## 2020-12-07 DIAGNOSIS — M549 Dorsalgia, unspecified: Secondary | ICD-10-CM | POA: Diagnosis not present

## 2020-12-07 DIAGNOSIS — E785 Hyperlipidemia, unspecified: Secondary | ICD-10-CM | POA: Diagnosis not present

## 2020-12-07 DIAGNOSIS — Z130 Encounter for screening for diseases of the blood and blood-forming organs and certain disorders involving the immune mechanism: Secondary | ICD-10-CM | POA: Diagnosis not present

## 2020-12-07 DIAGNOSIS — E119 Type 2 diabetes mellitus without complications: Secondary | ICD-10-CM | POA: Diagnosis not present

## 2020-12-17 DIAGNOSIS — M4316 Spondylolisthesis, lumbar region: Secondary | ICD-10-CM | POA: Diagnosis not present

## 2020-12-30 DIAGNOSIS — M48061 Spinal stenosis, lumbar region without neurogenic claudication: Secondary | ICD-10-CM | POA: Diagnosis not present

## 2020-12-30 DIAGNOSIS — M5136 Other intervertebral disc degeneration, lumbar region: Secondary | ICD-10-CM | POA: Diagnosis not present

## 2020-12-30 DIAGNOSIS — M545 Low back pain, unspecified: Secondary | ICD-10-CM | POA: Diagnosis not present

## 2020-12-30 DIAGNOSIS — M4606 Spinal enthesopathy, lumbar region: Secondary | ICD-10-CM | POA: Diagnosis not present

## 2020-12-30 DIAGNOSIS — M4316 Spondylolisthesis, lumbar region: Secondary | ICD-10-CM | POA: Diagnosis not present

## 2020-12-30 DIAGNOSIS — M4327 Fusion of spine, lumbosacral region: Secondary | ICD-10-CM | POA: Diagnosis not present

## 2021-01-11 DIAGNOSIS — M5416 Radiculopathy, lumbar region: Secondary | ICD-10-CM | POA: Diagnosis not present

## 2021-02-03 DIAGNOSIS — M47817 Spondylosis without myelopathy or radiculopathy, lumbosacral region: Secondary | ICD-10-CM | POA: Diagnosis not present

## 2021-02-03 DIAGNOSIS — M48061 Spinal stenosis, lumbar region without neurogenic claudication: Secondary | ICD-10-CM | POA: Diagnosis not present

## 2021-02-03 DIAGNOSIS — M5416 Radiculopathy, lumbar region: Secondary | ICD-10-CM | POA: Diagnosis not present

## 2021-02-03 DIAGNOSIS — M4316 Spondylolisthesis, lumbar region: Secondary | ICD-10-CM | POA: Diagnosis not present

## 2021-03-08 DIAGNOSIS — D519 Vitamin B12 deficiency anemia, unspecified: Secondary | ICD-10-CM | POA: Diagnosis not present

## 2021-03-08 DIAGNOSIS — E539 Vitamin B deficiency, unspecified: Secondary | ICD-10-CM | POA: Diagnosis not present

## 2021-03-08 DIAGNOSIS — Z760 Encounter for issue of repeat prescription: Secondary | ICD-10-CM | POA: Diagnosis not present

## 2021-03-08 DIAGNOSIS — E119 Type 2 diabetes mellitus without complications: Secondary | ICD-10-CM | POA: Diagnosis not present

## 2021-03-08 DIAGNOSIS — I10 Essential (primary) hypertension: Secondary | ICD-10-CM | POA: Diagnosis not present

## 2021-03-08 DIAGNOSIS — E782 Mixed hyperlipidemia: Secondary | ICD-10-CM | POA: Diagnosis not present

## 2021-03-08 DIAGNOSIS — Z130 Encounter for screening for diseases of the blood and blood-forming organs and certain disorders involving the immune mechanism: Secondary | ICD-10-CM | POA: Diagnosis not present

## 2021-03-08 DIAGNOSIS — M545 Low back pain, unspecified: Secondary | ICD-10-CM | POA: Diagnosis not present

## 2021-03-08 DIAGNOSIS — Z79899 Other long term (current) drug therapy: Secondary | ICD-10-CM | POA: Diagnosis not present

## 2021-03-08 DIAGNOSIS — E559 Vitamin D deficiency, unspecified: Secondary | ICD-10-CM | POA: Diagnosis not present

## 2021-06-14 DIAGNOSIS — Z130 Encounter for screening for diseases of the blood and blood-forming organs and certain disorders involving the immune mechanism: Secondary | ICD-10-CM | POA: Diagnosis not present

## 2021-06-14 DIAGNOSIS — Z23 Encounter for immunization: Secondary | ICD-10-CM | POA: Diagnosis not present

## 2021-06-14 DIAGNOSIS — E119 Type 2 diabetes mellitus without complications: Secondary | ICD-10-CM | POA: Diagnosis not present

## 2021-06-14 DIAGNOSIS — Z760 Encounter for issue of repeat prescription: Secondary | ICD-10-CM | POA: Diagnosis not present

## 2021-06-14 DIAGNOSIS — I10 Essential (primary) hypertension: Secondary | ICD-10-CM | POA: Diagnosis not present

## 2021-12-07 DIAGNOSIS — E559 Vitamin D deficiency, unspecified: Secondary | ICD-10-CM | POA: Diagnosis not present

## 2021-12-07 DIAGNOSIS — E039 Hypothyroidism, unspecified: Secondary | ICD-10-CM | POA: Diagnosis not present

## 2021-12-07 DIAGNOSIS — R42 Dizziness and giddiness: Secondary | ICD-10-CM | POA: Diagnosis not present

## 2021-12-07 DIAGNOSIS — I10 Essential (primary) hypertension: Secondary | ICD-10-CM | POA: Diagnosis not present

## 2021-12-07 DIAGNOSIS — Z130 Encounter for screening for diseases of the blood and blood-forming organs and certain disorders involving the immune mechanism: Secondary | ICD-10-CM | POA: Diagnosis not present

## 2021-12-07 DIAGNOSIS — E6609 Other obesity due to excess calories: Secondary | ICD-10-CM | POA: Diagnosis not present

## 2021-12-07 DIAGNOSIS — E119 Type 2 diabetes mellitus without complications: Secondary | ICD-10-CM | POA: Diagnosis not present

## 2021-12-07 DIAGNOSIS — Z760 Encounter for issue of repeat prescription: Secondary | ICD-10-CM | POA: Diagnosis not present

## 2021-12-27 DIAGNOSIS — Z0001 Encounter for general adult medical examination with abnormal findings: Secondary | ICD-10-CM | POA: Diagnosis not present

## 2021-12-27 DIAGNOSIS — E7849 Other hyperlipidemia: Secondary | ICD-10-CM | POA: Diagnosis not present

## 2021-12-27 DIAGNOSIS — E119 Type 2 diabetes mellitus without complications: Secondary | ICD-10-CM | POA: Diagnosis not present

## 2021-12-27 DIAGNOSIS — I10 Essential (primary) hypertension: Secondary | ICD-10-CM | POA: Diagnosis not present

## 2022-02-10 DIAGNOSIS — H2512 Age-related nuclear cataract, left eye: Secondary | ICD-10-CM | POA: Diagnosis not present

## 2022-02-10 DIAGNOSIS — H2513 Age-related nuclear cataract, bilateral: Secondary | ICD-10-CM | POA: Diagnosis not present

## 2022-02-10 DIAGNOSIS — H40013 Open angle with borderline findings, low risk, bilateral: Secondary | ICD-10-CM | POA: Diagnosis not present

## 2022-02-10 DIAGNOSIS — I10 Essential (primary) hypertension: Secondary | ICD-10-CM | POA: Diagnosis not present

## 2022-03-20 DIAGNOSIS — F3341 Major depressive disorder, recurrent, in partial remission: Secondary | ICD-10-CM | POA: Diagnosis not present

## 2022-03-20 DIAGNOSIS — Z008 Encounter for other general examination: Secondary | ICD-10-CM | POA: Diagnosis not present

## 2022-03-20 DIAGNOSIS — F411 Generalized anxiety disorder: Secondary | ICD-10-CM | POA: Diagnosis not present

## 2022-03-20 DIAGNOSIS — H3530 Unspecified macular degeneration: Secondary | ICD-10-CM | POA: Diagnosis not present

## 2022-03-20 DIAGNOSIS — E039 Hypothyroidism, unspecified: Secondary | ICD-10-CM | POA: Diagnosis not present

## 2022-03-20 DIAGNOSIS — E785 Hyperlipidemia, unspecified: Secondary | ICD-10-CM | POA: Diagnosis not present

## 2022-03-20 DIAGNOSIS — R32 Unspecified urinary incontinence: Secondary | ICD-10-CM | POA: Diagnosis not present

## 2022-03-20 DIAGNOSIS — R600 Localized edema: Secondary | ICD-10-CM | POA: Diagnosis not present

## 2022-03-20 DIAGNOSIS — I10 Essential (primary) hypertension: Secondary | ICD-10-CM | POA: Diagnosis not present

## 2022-03-20 DIAGNOSIS — E1136 Type 2 diabetes mellitus with diabetic cataract: Secondary | ICD-10-CM | POA: Diagnosis not present

## 2022-03-20 DIAGNOSIS — M1990 Unspecified osteoarthritis, unspecified site: Secondary | ICD-10-CM | POA: Diagnosis not present

## 2022-03-20 DIAGNOSIS — I700 Atherosclerosis of aorta: Secondary | ICD-10-CM | POA: Diagnosis not present

## 2022-03-29 DIAGNOSIS — E782 Mixed hyperlipidemia: Secondary | ICD-10-CM | POA: Diagnosis not present

## 2022-03-29 DIAGNOSIS — F331 Major depressive disorder, recurrent, moderate: Secondary | ICD-10-CM | POA: Diagnosis not present

## 2022-03-29 DIAGNOSIS — I10 Essential (primary) hypertension: Secondary | ICD-10-CM | POA: Diagnosis not present

## 2022-03-29 DIAGNOSIS — M151 Heberden's nodes (with arthropathy): Secondary | ICD-10-CM | POA: Diagnosis not present

## 2022-03-29 DIAGNOSIS — E559 Vitamin D deficiency, unspecified: Secondary | ICD-10-CM | POA: Diagnosis not present

## 2022-03-29 DIAGNOSIS — M2550 Pain in unspecified joint: Secondary | ICD-10-CM | POA: Diagnosis not present

## 2022-03-29 DIAGNOSIS — E039 Hypothyroidism, unspecified: Secondary | ICD-10-CM | POA: Diagnosis not present

## 2022-04-06 DIAGNOSIS — H2512 Age-related nuclear cataract, left eye: Secondary | ICD-10-CM | POA: Diagnosis not present

## 2022-04-07 DIAGNOSIS — H2511 Age-related nuclear cataract, right eye: Secondary | ICD-10-CM | POA: Diagnosis not present

## 2022-04-27 DIAGNOSIS — H2511 Age-related nuclear cataract, right eye: Secondary | ICD-10-CM | POA: Diagnosis not present

## 2022-04-30 DIAGNOSIS — Z1212 Encounter for screening for malignant neoplasm of rectum: Secondary | ICD-10-CM | POA: Diagnosis not present

## 2022-04-30 DIAGNOSIS — Z1211 Encounter for screening for malignant neoplasm of colon: Secondary | ICD-10-CM | POA: Diagnosis not present

## 2022-05-04 DIAGNOSIS — Z1231 Encounter for screening mammogram for malignant neoplasm of breast: Secondary | ICD-10-CM | POA: Diagnosis not present

## 2022-05-04 DIAGNOSIS — F03A0 Unspecified dementia, mild, without behavioral disturbance, psychotic disturbance, mood disturbance, and anxiety: Secondary | ICD-10-CM | POA: Diagnosis not present

## 2022-05-04 DIAGNOSIS — H269 Unspecified cataract: Secondary | ICD-10-CM | POA: Diagnosis not present

## 2022-05-04 DIAGNOSIS — E039 Hypothyroidism, unspecified: Secondary | ICD-10-CM | POA: Diagnosis not present

## 2022-05-04 DIAGNOSIS — M2550 Pain in unspecified joint: Secondary | ICD-10-CM | POA: Diagnosis not present

## 2022-05-04 DIAGNOSIS — E559 Vitamin D deficiency, unspecified: Secondary | ICD-10-CM | POA: Diagnosis not present

## 2022-05-04 DIAGNOSIS — I10 Essential (primary) hypertension: Secondary | ICD-10-CM | POA: Diagnosis not present

## 2022-05-04 DIAGNOSIS — F331 Major depressive disorder, recurrent, moderate: Secondary | ICD-10-CM | POA: Diagnosis not present

## 2022-05-04 DIAGNOSIS — M151 Heberden's nodes (with arthropathy): Secondary | ICD-10-CM | POA: Diagnosis not present

## 2022-05-04 DIAGNOSIS — E782 Mixed hyperlipidemia: Secondary | ICD-10-CM | POA: Diagnosis not present

## 2022-05-04 DIAGNOSIS — Z1389 Encounter for screening for other disorder: Secondary | ICD-10-CM | POA: Diagnosis not present

## 2022-05-17 HISTORY — PX: WRIST SURGERY: SHX841

## 2022-06-02 DIAGNOSIS — I10 Essential (primary) hypertension: Secondary | ICD-10-CM | POA: Diagnosis not present

## 2022-06-02 DIAGNOSIS — F331 Major depressive disorder, recurrent, moderate: Secondary | ICD-10-CM | POA: Diagnosis not present

## 2022-06-02 DIAGNOSIS — M151 Heberden's nodes (with arthropathy): Secondary | ICD-10-CM | POA: Diagnosis not present

## 2022-06-02 DIAGNOSIS — M2550 Pain in unspecified joint: Secondary | ICD-10-CM | POA: Diagnosis not present

## 2022-06-02 DIAGNOSIS — E039 Hypothyroidism, unspecified: Secondary | ICD-10-CM | POA: Diagnosis not present

## 2022-06-02 DIAGNOSIS — F03A0 Unspecified dementia, mild, without behavioral disturbance, psychotic disturbance, mood disturbance, and anxiety: Secondary | ICD-10-CM | POA: Diagnosis not present

## 2022-06-02 DIAGNOSIS — E782 Mixed hyperlipidemia: Secondary | ICD-10-CM | POA: Diagnosis not present

## 2022-06-02 DIAGNOSIS — E559 Vitamin D deficiency, unspecified: Secondary | ICD-10-CM | POA: Diagnosis not present

## 2022-06-02 DIAGNOSIS — Z1231 Encounter for screening mammogram for malignant neoplasm of breast: Secondary | ICD-10-CM | POA: Diagnosis not present

## 2022-06-03 DIAGNOSIS — K6389 Other specified diseases of intestine: Secondary | ICD-10-CM | POA: Diagnosis not present

## 2022-06-03 DIAGNOSIS — M2578 Osteophyte, vertebrae: Secondary | ICD-10-CM | POA: Diagnosis not present

## 2022-06-03 DIAGNOSIS — G9389 Other specified disorders of brain: Secondary | ICD-10-CM | POA: Diagnosis not present

## 2022-06-03 DIAGNOSIS — S52572A Other intraarticular fracture of lower end of left radius, initial encounter for closed fracture: Secondary | ICD-10-CM | POA: Diagnosis not present

## 2022-06-03 DIAGNOSIS — S52502A Unspecified fracture of the lower end of left radius, initial encounter for closed fracture: Secondary | ICD-10-CM | POA: Diagnosis not present

## 2022-06-03 DIAGNOSIS — S301XXA Contusion of abdominal wall, initial encounter: Secondary | ICD-10-CM | POA: Diagnosis not present

## 2022-06-03 DIAGNOSIS — M47812 Spondylosis without myelopathy or radiculopathy, cervical region: Secondary | ICD-10-CM | POA: Diagnosis not present

## 2022-06-03 DIAGNOSIS — G319 Degenerative disease of nervous system, unspecified: Secondary | ICD-10-CM | POA: Diagnosis not present

## 2022-06-03 DIAGNOSIS — E119 Type 2 diabetes mellitus without complications: Secondary | ICD-10-CM | POA: Diagnosis not present

## 2022-06-03 DIAGNOSIS — K449 Diaphragmatic hernia without obstruction or gangrene: Secondary | ICD-10-CM | POA: Diagnosis not present

## 2022-06-03 DIAGNOSIS — W11XXXA Fall on and from ladder, initial encounter: Secondary | ICD-10-CM | POA: Diagnosis not present

## 2022-06-03 DIAGNOSIS — S0990XA Unspecified injury of head, initial encounter: Secondary | ICD-10-CM | POA: Diagnosis not present

## 2022-06-03 DIAGNOSIS — S199XXA Unspecified injury of neck, initial encounter: Secondary | ICD-10-CM | POA: Diagnosis not present

## 2022-06-03 DIAGNOSIS — S52592A Other fractures of lower end of left radius, initial encounter for closed fracture: Secondary | ICD-10-CM | POA: Diagnosis not present

## 2022-06-03 DIAGNOSIS — M4602 Spinal enthesopathy, cervical region: Secondary | ICD-10-CM | POA: Diagnosis not present

## 2022-06-03 DIAGNOSIS — S299XXA Unspecified injury of thorax, initial encounter: Secondary | ICD-10-CM | POA: Diagnosis not present

## 2022-06-03 DIAGNOSIS — M189 Osteoarthritis of first carpometacarpal joint, unspecified: Secondary | ICD-10-CM | POA: Diagnosis not present

## 2022-06-03 DIAGNOSIS — S0003XA Contusion of scalp, initial encounter: Secondary | ICD-10-CM | POA: Diagnosis not present

## 2022-06-03 DIAGNOSIS — J323 Chronic sphenoidal sinusitis: Secondary | ICD-10-CM | POA: Diagnosis not present

## 2022-06-03 DIAGNOSIS — R519 Headache, unspecified: Secondary | ICD-10-CM | POA: Diagnosis not present

## 2022-06-06 DIAGNOSIS — S52509A Unspecified fracture of the lower end of unspecified radius, initial encounter for closed fracture: Secondary | ICD-10-CM | POA: Diagnosis not present

## 2022-06-07 DIAGNOSIS — W19XXXA Unspecified fall, initial encounter: Secondary | ICD-10-CM | POA: Diagnosis not present

## 2022-06-07 DIAGNOSIS — I10 Essential (primary) hypertension: Secondary | ICD-10-CM | POA: Diagnosis not present

## 2022-06-07 DIAGNOSIS — G8918 Other acute postprocedural pain: Secondary | ICD-10-CM | POA: Diagnosis not present

## 2022-06-07 DIAGNOSIS — Z79899 Other long term (current) drug therapy: Secondary | ICD-10-CM | POA: Diagnosis not present

## 2022-06-07 DIAGNOSIS — E039 Hypothyroidism, unspecified: Secondary | ICD-10-CM | POA: Diagnosis not present

## 2022-06-07 DIAGNOSIS — F419 Anxiety disorder, unspecified: Secondary | ICD-10-CM | POA: Diagnosis not present

## 2022-06-07 DIAGNOSIS — E785 Hyperlipidemia, unspecified: Secondary | ICD-10-CM | POA: Diagnosis not present

## 2022-06-07 DIAGNOSIS — S52572A Other intraarticular fracture of lower end of left radius, initial encounter for closed fracture: Secondary | ICD-10-CM | POA: Diagnosis not present

## 2022-06-23 DIAGNOSIS — S52602D Unspecified fracture of lower end of left ulna, subsequent encounter for closed fracture with routine healing: Secondary | ICD-10-CM | POA: Diagnosis not present

## 2022-06-23 DIAGNOSIS — S52502D Unspecified fracture of the lower end of left radius, subsequent encounter for closed fracture with routine healing: Secondary | ICD-10-CM | POA: Diagnosis not present

## 2022-06-23 DIAGNOSIS — S52509A Unspecified fracture of the lower end of unspecified radius, initial encounter for closed fracture: Secondary | ICD-10-CM | POA: Diagnosis not present

## 2022-07-21 DIAGNOSIS — S52502A Unspecified fracture of the lower end of left radius, initial encounter for closed fracture: Secondary | ICD-10-CM | POA: Diagnosis not present

## 2022-07-21 DIAGNOSIS — S52509A Unspecified fracture of the lower end of unspecified radius, initial encounter for closed fracture: Secondary | ICD-10-CM | POA: Diagnosis not present

## 2022-08-09 DIAGNOSIS — Z1231 Encounter for screening mammogram for malignant neoplasm of breast: Secondary | ICD-10-CM | POA: Diagnosis not present

## 2022-08-09 DIAGNOSIS — F331 Major depressive disorder, recurrent, moderate: Secondary | ICD-10-CM | POA: Diagnosis not present

## 2022-08-09 DIAGNOSIS — E039 Hypothyroidism, unspecified: Secondary | ICD-10-CM | POA: Diagnosis not present

## 2022-08-09 DIAGNOSIS — E559 Vitamin D deficiency, unspecified: Secondary | ICD-10-CM | POA: Diagnosis not present

## 2022-08-09 DIAGNOSIS — M2550 Pain in unspecified joint: Secondary | ICD-10-CM | POA: Diagnosis not present

## 2022-08-09 DIAGNOSIS — M151 Heberden's nodes (with arthropathy): Secondary | ICD-10-CM | POA: Diagnosis not present

## 2022-08-09 DIAGNOSIS — E782 Mixed hyperlipidemia: Secondary | ICD-10-CM | POA: Diagnosis not present

## 2022-08-09 DIAGNOSIS — F03A0 Unspecified dementia, mild, without behavioral disturbance, psychotic disturbance, mood disturbance, and anxiety: Secondary | ICD-10-CM | POA: Diagnosis not present

## 2022-08-09 DIAGNOSIS — I10 Essential (primary) hypertension: Secondary | ICD-10-CM | POA: Diagnosis not present

## 2022-08-23 DIAGNOSIS — F03A0 Unspecified dementia, mild, without behavioral disturbance, psychotic disturbance, mood disturbance, and anxiety: Secondary | ICD-10-CM | POA: Diagnosis not present

## 2022-08-23 DIAGNOSIS — F331 Major depressive disorder, recurrent, moderate: Secondary | ICD-10-CM | POA: Diagnosis not present

## 2022-08-23 DIAGNOSIS — E039 Hypothyroidism, unspecified: Secondary | ICD-10-CM | POA: Diagnosis not present

## 2022-08-23 DIAGNOSIS — M2550 Pain in unspecified joint: Secondary | ICD-10-CM | POA: Diagnosis not present

## 2022-08-23 DIAGNOSIS — M151 Heberden's nodes (with arthropathy): Secondary | ICD-10-CM | POA: Diagnosis not present

## 2022-08-23 DIAGNOSIS — Z1231 Encounter for screening mammogram for malignant neoplasm of breast: Secondary | ICD-10-CM | POA: Diagnosis not present

## 2022-08-23 DIAGNOSIS — E559 Vitamin D deficiency, unspecified: Secondary | ICD-10-CM | POA: Diagnosis not present

## 2022-08-23 DIAGNOSIS — E782 Mixed hyperlipidemia: Secondary | ICD-10-CM | POA: Diagnosis not present

## 2022-08-23 DIAGNOSIS — I10 Essential (primary) hypertension: Secondary | ICD-10-CM | POA: Diagnosis not present

## 2022-11-29 DIAGNOSIS — M1711 Unilateral primary osteoarthritis, right knee: Secondary | ICD-10-CM | POA: Diagnosis not present

## 2022-12-14 NOTE — Progress Notes (Signed)
Office Visit Note  Patient: Christina Villegas             Date of Birth: 07-Apr-1951           MRN: [id number]             PCP: Ailene Ravel, MD Referring: Grayland Jack, * Visit Date: 12/26/2022 Occupation: @GUAROCC @  Subjective:  Pain in both hands  History of Present Illness: Christina Villegas is a 72 y.o. female seen in consultation at the request of her PCP.  According the patient she has had arthritis in her hands for more than 10 years.  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  She retired at the age of 26.  She states the hand pain and discomfort has been progressively getting worse.  She fell in November 2023 and fractured her left wrist.  She states she required surgery and cast after the surgery.  She states the cast was too tight and compressed on her left fifth finger.  She states since she had surgery she has been having numbness in her left hand.  She also has difficulty making a fist.  She complains of some discomfort in her neck and right trapezius region.  She also has severe osteoarthritis in her knee joints per patient.  She has been followed by Dr. Thurston Hole.  She states that her right total knee replacement is a scheduled for January 30, 2023.  She states her mother had osteoarthritis.  She is gravida 3, para 2, miscarriage 1.  There is no history of DVTs.    Activities of Daily Living:  Patient reports morning stiffness for all day. Patient Reports nocturnal pain.  Difficulty dressing/grooming: Denies Difficulty climbing stairs: Reports Difficulty getting out of chair: Reports Difficulty using hands for taps, buttons, cutlery, and/or writing: Reports  Review of Systems  Constitutional:  Positive for fatigue.  HENT:  Positive for mouth dryness. Negative for mouth sores.   Eyes:  Positive for visual disturbance. Negative for dryness.  Respiratory:  Positive for shortness of breath.   Cardiovascular:  Negative for  chest pain and palpitations.  Gastrointestinal:  Positive for diarrhea. Negative for blood in stool and constipation.  Endocrine: Negative for increased urination.  Genitourinary:  Positive for involuntary urination.  Musculoskeletal:  Positive for joint pain, gait problem, joint pain, joint swelling, myalgias, muscle weakness, morning stiffness, muscle tenderness and myalgias.  Skin:  Positive for hair loss and sensitivity to sunlight. Negative for color change and rash.  Allergic/Immunologic: Negative for susceptible to infections.  Neurological:  Positive for dizziness and parasthesias. Negative for headaches.  Hematological:  Negative for swollen glands.  Psychiatric/Behavioral:  Positive for sleep disturbance. Negative for depressed mood. The patient is nervous/anxious.     PMFS History:  Patient Active Problem List   Diagnosis Date Noted   Syncope 05/21/2013   EKG abnormalities 05/21/2013   Hypertension 05/21/2013    Past Medical History:  Diagnosis Date   High cholesterol    Hypertension    Pre-diabetes     Family History  Problem Relation Age of Onset   Heart failure Mother    Hypertension Mother    Heart failure Father    Hypertension Father    Heart attack Father    Diabetes Sister    Heart disease Sister    Healthy Son    Healthy Son    Past Surgical History:  Procedure Laterality Date  ABLATION Right    vein ablation- right leg   KNEE ARTHROSCOPY     rt side   WRIST SURGERY Left 05/2022   Social History   Social History Narrative   Not on file    There is no immunization history on file for this patient.   Objective: Vital Signs: BP (!) 155/88 (BP Location: Right Wrist, Patient Position: Sitting, Cuff Size: Normal)   Pulse 63   Resp 16   Ht 5\' 3"  (1.6 m)   Wt 205 lb (93 kg)   BMI 36.31 kg/m    Physical Exam Vitals and nursing note reviewed.  Constitutional:      Appearance: She is well-developed.  HENT:     Head: Normocephalic and  atraumatic.  Eyes:     Conjunctiva/sclera: Conjunctivae normal.  Cardiovascular:     Rate and Rhythm: Normal rate and regular rhythm.     Heart sounds: Normal heart sounds.  Pulmonary:     Effort: Pulmonary effort is normal.     Breath sounds: Normal breath sounds.  Abdominal:     General: Bowel sounds are normal.     Palpations: Abdomen is soft.  Musculoskeletal:     Cervical back: Normal range of motion.  Lymphadenopathy:     Cervical: No cervical adenopathy.  Skin:    General: Skin is warm and dry.     Capillary Refill: Capillary refill takes less than 2 seconds.  Neurological:     Mental Status: She is alert and oriented to person, place, and time.  Psychiatric:        Behavior: Behavior normal.      Musculoskeletal Exam: Patient had limited lateral rotation flexion and abduction of her cervical spine.  She had good range of motion of her shoulders and elbow joints.  She has surgical scar on her left wrist.  Bilateral CMC PIP and DIP thickening with incomplete fist formation was noted.  Subluxation of most of her DIP joints was noted.  No synovitis was noted.  Hip joints were in good range of motion.  Knee joints were in good range of motion with discomfort without any warmth swelling or effusion.  There was no tenderness over ankles or MTPs.  CDAI Exam: CDAI Score: -- Patient Global: --; Provider Global: -- Swollen: --; Tender: -- Joint Exam 12/26/2022   No joint exam has been documented for this visit   There is currently no information documented on the homunculus. Go to the Rheumatology activity and complete the homunculus joint exam.  Investigation: No additional findings.  Imaging: XR Hand 2 View Left  Result Date: 12/26/2022 Hardware was noted in the distal radius.  CMC narrowing and subluxation was noted.  Severe PIP and DIP narrowing was noted.  Subluxation of first and second DIP joints was noted.  No intercarpal or radiocarpal joint space narrowing was  noted.  No MCP narrowing was noted. Impression: These findings are suggestive of osteoarthritis of the hand.  XR Hand 2 View Right  Result Date: 12/26/2022 Severe CMC narrowing and subluxation was noted.  Severe PIP and DIP narrowing was noted.  Subluxation of first second and third DIP joints was noted.  No intercarpal radiocarpal joint space narrowing was noted.  No erosive changes were noted. Impression: These findings are suggestive of severe osteoarthritis of the hand.  XR Cervical Spine 2 or 3 views  Result Date: 12/26/2022 Multilevel spondylosis with severe narrowing of C5-C6 and C6-C7 was noted.  C7-T1 was not clearly visualized which could  possibly be fused.  Anterior osteophytes were noted.  Facet joint arthropathy was noted. Impression: These findings are suggestive of severe degenerative disc disease.   Recent Labs: Lab Results  Component Value Date   WBC 10.1 05/22/2013   HGB 12.2 05/22/2013   PLT 182 05/22/2013   NA 140 05/22/2013   K 3.2 (L) 05/22/2013   CL 104 05/22/2013   CO2 29 05/22/2013   GLUCOSE 103 (H) 05/22/2013   BUN 15 05/22/2013   CREATININE 0.79 05/22/2013   BILITOT 0.4 05/22/2013   ALKPHOS 69 05/22/2013   AST 17 05/22/2013   ALT 14 05/22/2013   PROT 6.0 05/22/2013   ALBUMIN 3.5 05/22/2013   CALCIUM 8.9 05/22/2013   GFRAA >90 05/22/2013    Speciality Comments: No specialty comments available.  Procedures:  No procedures performed Allergies: Hydrocodone   Assessment / Plan:     Visit Diagnoses: Pain in both hands -patient complains of pain and discomfort in the bilateral hands with decreased grip strength and incomplete fist formation.  She has severe PIP and DIP narrowing with DIP subluxation.  No synovitis was noted.  Clinical findings were consistent with osteoarthritis.  Detailed counseling regarding osteoarthritis was provided.  Joint protection was discussed.  A handout on hand exercises was given.  Plan: XR Hand 2 View Right, XR Hand 2 View  Left.  X-rays of bilateral hands showed severe CMC PIP and DIP narrowing with subluxation of several of the DIP joints.  X-ray findings were discussed with the patient.  She would benefit from physical therapy.  I will place the referral.  Hardware was noted in the left radius.  Heberden's nodes of both hands - 03/29/22: RF<10, anti-CCP 4, carbamylated protein antibody 33, thyroid panel WNL  Paresthesia in left hand-she complains of having paresthesia in her left hand since the wrist surgery.  I offered nerve conduction velocities but she would like to wait at this time.  I will discuss at the follow-up visit.  Primary osteoarthritis of both knees-patient reports that she has severe osteoarthritis in her knee joints.  She has difficulty walking.  She is a scheduled to have right total knee replacement by Dr. Thurston Hole on January 30, 2023.  No warmth swelling or effusion was noted.  Neck pain -she complains of pain and discomfort in her cervical spine.  She has limited range of motion.  Plan: XR Cervical Spine 2 or 3 views.  Multilevel spondylosis with severe narrowing of C5-C6, C6-C7 and possible C7-T1 fusion was noted.  Anterior osteophytes are noted.  A handout on cervical spine exercises was given.  X-ray findings were reviewed with the patient. A referral for physical therapy was placed.  DDD thoracic spine-a previous x-rays from 2014 was reviewed which showed multilevel degenerative osteophytosis.  Chronic midline low back pain without sciatica-she complains of lower back pain without sciatica.  She has some discomfort in the lower lumbar paravertebral region.  A handout on back exercises was given.  She would benefit from physical therapy in the future.  Vitamin D deficiency-she takes vitamin D supplement.  Essential hypertension-blood pressure was elevated at 155/82.  Repeat blood pressure was 155/88.  She was advised to monitor blood pressure closely and follow-up with the PCP.  High  cholesterol  Palpitations  Anxiety and depression  Former smoker - 1 and half pack per day for 30 years.  He quit smoking 2009.  Orders: Orders Placed This Encounter  Procedures   XR Hand 2 View Right   XR  Hand 2 View Left   XR Cervical Spine 2 or 3 views   Ambulatory referral to Physical Therapy   No orders of the defined types were placed in this encounter.    Follow-Up Instructions: Return in about 6 months (around 06/27/2023) for Osteoarthritis.   Pollyann Savoy, MD  Note - This record has been created using Animal nutritionist.  Chart creation errors have been sought, but may not always  have been located. Such creation errors do not reflect on  the standard of medical care.

## 2022-12-15 DIAGNOSIS — Z9181 History of falling: Secondary | ICD-10-CM | POA: Diagnosis not present

## 2022-12-15 DIAGNOSIS — E785 Hyperlipidemia, unspecified: Secondary | ICD-10-CM | POA: Diagnosis not present

## 2022-12-15 DIAGNOSIS — E039 Hypothyroidism, unspecified: Secondary | ICD-10-CM | POA: Diagnosis not present

## 2022-12-15 DIAGNOSIS — I10 Essential (primary) hypertension: Secondary | ICD-10-CM | POA: Diagnosis not present

## 2022-12-26 ENCOUNTER — Ambulatory Visit: Payer: No Typology Code available for payment source

## 2022-12-26 ENCOUNTER — Ambulatory Visit: Payer: No Typology Code available for payment source | Attending: Rheumatology | Admitting: Rheumatology

## 2022-12-26 ENCOUNTER — Ambulatory Visit (INDEPENDENT_AMBULATORY_CARE_PROVIDER_SITE_OTHER): Payer: No Typology Code available for payment source

## 2022-12-26 ENCOUNTER — Encounter: Payer: Self-pay | Admitting: Rheumatology

## 2022-12-26 VITALS — BP 155/88 | HR 63 | Resp 16 | Ht 63.0 in | Wt 205.0 lb

## 2022-12-26 DIAGNOSIS — M542 Cervicalgia: Secondary | ICD-10-CM | POA: Diagnosis not present

## 2022-12-26 DIAGNOSIS — M151 Heberden's nodes (with arthropathy): Secondary | ICD-10-CM

## 2022-12-26 DIAGNOSIS — R002 Palpitations: Secondary | ICD-10-CM | POA: Diagnosis not present

## 2022-12-26 DIAGNOSIS — Z87891 Personal history of nicotine dependence: Secondary | ICD-10-CM

## 2022-12-26 DIAGNOSIS — M79641 Pain in right hand: Secondary | ICD-10-CM

## 2022-12-26 DIAGNOSIS — M79642 Pain in left hand: Secondary | ICD-10-CM

## 2022-12-26 DIAGNOSIS — E559 Vitamin D deficiency, unspecified: Secondary | ICD-10-CM

## 2022-12-26 DIAGNOSIS — F419 Anxiety disorder, unspecified: Secondary | ICD-10-CM | POA: Diagnosis not present

## 2022-12-26 DIAGNOSIS — I10 Essential (primary) hypertension: Secondary | ICD-10-CM

## 2022-12-26 DIAGNOSIS — G8929 Other chronic pain: Secondary | ICD-10-CM

## 2022-12-26 DIAGNOSIS — M545 Low back pain, unspecified: Secondary | ICD-10-CM

## 2022-12-26 DIAGNOSIS — E0590 Thyrotoxicosis, unspecified without thyrotoxic crisis or storm: Secondary | ICD-10-CM

## 2022-12-26 DIAGNOSIS — E7800 Pure hypercholesterolemia, unspecified: Secondary | ICD-10-CM | POA: Diagnosis not present

## 2022-12-26 DIAGNOSIS — M5134 Other intervertebral disc degeneration, thoracic region: Secondary | ICD-10-CM

## 2022-12-26 DIAGNOSIS — R202 Paresthesia of skin: Secondary | ICD-10-CM

## 2022-12-26 DIAGNOSIS — F32A Depression, unspecified: Secondary | ICD-10-CM

## 2022-12-26 DIAGNOSIS — M170 Bilateral primary osteoarthritis of knee: Secondary | ICD-10-CM | POA: Diagnosis not present

## 2022-12-26 NOTE — Patient Instructions (Signed)
Cervical Strain and Sprain Rehab Ask your health care provider which exercises are safe for you. Do exercises exactly as told by your health care provider and adjust them as directed. It is normal to feel mild stretching, pulling, tightness, or discomfort as you do these exercises. Stop right away if you feel sudden pain or your pain gets worse. Do not begin these exercises until told by your health care provider. Stretching and range-of-motion exercises Cervical side bending  Using good posture, sit on a stable chair or stand up. Without moving your shoulders, slowly tilt your left / right ear to your shoulder until you feel a stretch in the neck muscles on the opposite side. You should be looking straight ahead. Hold for __________ seconds. Repeat with the other side of your neck. Repeat __________ times. Complete this exercise __________ times a day. Cervical rotation  Using good posture, sit on a stable chair or stand up. Slowly turn your head to the side as if you are looking over your left / right shoulder. Keep your eyes level with the ground. Stop when you feel a stretch along the side and the back of your neck. Hold for __________ seconds. Repeat this by turning to your other side. Repeat __________ times. Complete this exercise __________ times a day. Thoracic extension and pectoral stretch  Roll a towel or a small blanket so it is about 4 inches (10 cm) in diameter. Lie down on your back on a firm surface. Put the towel in the middle of your back across your spine. It should not be under your shoulder blades. Put your hands behind your head and let your elbows fall out to your sides. Hold for __________ seconds. Repeat __________ times. Complete this exercise __________ times a day. Strengthening exercises Upper cervical flexion  Lie on your back with a thin pillow behind your head or a small, rolled-up towel under your neck. Gently tuck your chin toward your chest and nod  your head down to look toward your feet. Do not lift your head off the pillow. Hold for __________ seconds. Release the tension slowly. Relax your neck muscles completely before you repeat this exercise. Repeat __________ times. Complete this exercise __________ times a day. Cervical extension  Stand about 6 inches (15 cm) away from a wall, with your back facing the wall. Place a soft object, about 6-8 inches (15-20 cm) in diameter, between the back of your head and the wall. A soft object could be a small pillow, a ball, or a folded towel. Gently tilt your head back and press into the soft object. Keep your jaw and forehead relaxed. Hold for __________ seconds. Release the tension slowly. Relax your neck muscles completely before you repeat this exercise. Repeat __________ times. Complete this exercise __________ times a day. Posture and body mechanics Body mechanics refer to the movements and positions of your body while you do your daily activities. Posture is part of body mechanics. Good posture and healthy body mechanics can help to relieve stress in your body's tissues and joints. Good posture means that your spine is in its natural S-curve position (your spine is neutral), your shoulders are pulled back slightly, and your head is not tipped forward. The following are general guidelines for using improved posture and body mechanics in your everyday activities. Sitting  When sitting, keep your spine neutral and keep your feet flat on the floor. Use a footrest, if needed, and keep your thighs parallel to the floor. Avoid rounding   your shoulders. Avoid tilting your head forward. When working at a desk or a computer, keep your desk at a height where your hands are slightly lower than your elbows. Slide your chair under your desk so you are close enough to maintain good posture. When working at a computer, place your monitor at a height where you are looking straight ahead and you do not have to  tilt your head forward or downward to look at the screen. Standing  When standing, keep your spine neutral and keep your feet about hip-width apart. Keep a slight bend in your knees. Your ears, shoulders, and hips should line up. When you do a task in which you stand in one place for a long time, place one foot up on a stable object that is 2-4 inches (5-10 cm) high, such as a footstool. This helps keep your spine neutral. Resting When lying down and resting, avoid positions that are most painful for you. Try to support your neck in a neutral position. You can use a contour pillow or a small rolled-up towel. Your pillow should support your neck but not push on it. This information is not intended to replace advice given to you by your health care provider. Make sure you discuss any questions you have with your health care provider. Document Revised: 01/23/2022 Document Reviewed: 01/23/2022 [employer] Patient Education  2024 [employer] Hand Exercises Hand exercises can be helpful for almost anyone. They can strengthen your hands and improve flexibility and movement. The exercises can also increase blood flow to the hands. These results can make your work and daily tasks easier for you. Hand exercises can be especially helpful for people who have joint pain from arthritis or nerve damage from using their hands over and over. These exercises can also help people who injure a hand. Exercises Most of these hand exercises are gentle stretching and motion exercises. It is usually safe to do them often throughout the day. Warming up your hands before exercise may help reduce stiffness. You can do this with gentle massage or by placing your hands in warm water for 10-15 minutes. It is normal to feel some stretching, pulling, tightness, or mild discomfort when you begin new exercises. In time, this will improve. Remember to always be careful and stop right away if you feel sudden, very bad pain or your pain  gets worse. You want to get better and be safe. Ask your health care provider which exercises are safe for you. Do exercises exactly as told by your provider and adjust them as told. Do not begin these exercises until told by your provider. Knuckle bend or "claw" fist  Stand or sit with your arm, hand, and all five fingers pointed straight up. Make sure to keep your wrist straight. Gently bend your fingers down toward your palm until the tips of your fingers are touching your palm. Keep your big knuckle straight and only bend the small knuckles in your fingers. Hold this position for 10 seconds. Straighten your fingers back to your starting position. Repeat this exercise 5-10 times with each hand. Full finger fist  Stand or sit with your arm, hand, and all five fingers pointed straight up. Make sure to keep your wrist straight. Gently bend your fingers into your palm until the tips of your fingers are touching the middle of your palm. Hold this position for 10 seconds. Extend your fingers back to your starting position, stretching every joint fully. Repeat this exercise 5-10 times  with each hand. Straight fist  Stand or sit with your arm, hand, and all five fingers pointed straight up. Make sure to keep your wrist straight. Gently bend your fingers at the big knuckle, where your fingers meet your hand, and at the middle knuckle. Keep the knuckle at the tips of your fingers straight and try to touch the bottom of your palm. Hold this position for 10 seconds. Extend your fingers back to your starting position, stretching every joint fully. Repeat this exercise 5-10 times with each hand. Tabletop  Stand or sit with your arm, hand, and all five fingers pointed straight up. Make sure to keep your wrist straight. Gently bend your fingers at the big knuckle, where your fingers meet your hand, as far down as you can. Keep the small knuckles in your fingers straight. Think of forming a tabletop with  your fingers. Hold this position for 10 seconds. Extend your fingers back to your starting position, stretching every joint fully. Repeat this exercise 5-10 times with each hand. Finger spread  Place your hand flat on a table with your palm facing down. Make sure your wrist stays straight. Spread your fingers and thumb apart from each other as far as you can until you feel a gentle stretch. Hold this position for 10 seconds. Bring your fingers and thumb tight together again. Hold this position for 10 seconds. Repeat this exercise 5-10 times with each hand. Making circles  Stand or sit with your arm, hand, and all five fingers pointed straight up. Make sure to keep your wrist straight. Make a circle by touching the tip of your thumb to the tip of your index finger. Hold for 10 seconds. Then open your hand wide. Repeat this motion with your thumb and each of your fingers. Repeat this exercise 5-10 times with each hand. Thumb motion  Sit with your forearm resting on a table and your wrist straight. Your thumb should be facing up toward the ceiling. Keep your fingers relaxed as you move your thumb. Lift your thumb up as high as you can toward the ceiling. Hold for 10 seconds. Bend your thumb across your palm as far as you can, reaching the tip of your thumb for the small finger (pinkie) side of your palm. Hold for 10 seconds. Repeat this exercise 5-10 times with each hand. Grip strengthening  Hold a stress ball or other soft ball in the middle of your hand. Slowly increase the pressure, squeezing the ball as much as you can without causing pain. Think of bringing the tips of your fingers into the middle of your palm. All of your finger joints should bend when doing this exercise. Hold your squeeze for 10 seconds, then relax. Repeat this exercise 5-10 times with each hand. Contact a health care provider if: Your hand pain or discomfort gets much worse when you do an exercise. Your hand pain  or discomfort does not improve within 2 hours after you exercise. If you have either of these problems, stop doing these exercises right away. Do not do them again unless your provider says that you can. Get help right away if: You develop sudden, severe hand pain or swelling. If this happens, stop doing these exercises right away. Do not do them again unless your provider says that you can. This information is not intended to replace advice given to you by your health care provider. Make sure you discuss any questions you have with your health care provider. Document Revised: 07/18/2022  Document Reviewed: 07/18/2022 [employer] Patient Education  2024 [employer]   Low Back Sprain or Strain Rehab Ask your health care provider which exercises are safe for you. Do exercises exactly as told by your health care provider and adjust them as directed. It is normal to feel mild stretching, pulling, tightness, or discomfort as you do these exercises. Stop right away if you feel sudden pain or your pain gets worse. Do not begin these exercises until told by your health care provider. Stretching and range-of-motion exercises These exercises warm up your muscles and joints and improve the movement and flexibility of your back. These exercises also help to relieve pain, numbness, and tingling. Lumbar rotation  Lie on your back on a firm bed or the floor with your knees bent. Straighten your arms out to your sides so each arm forms a 90-degree angle (right angle) with a side of your body. Slowly move (rotate) both of your knees to one side of your body until you feel a stretch in your lower back (lumbar). Try not to let your shoulders lift off the floor. Hold this position for __________ seconds. Tense your abdominal muscles and slowly move your knees back to the starting position. Repeat this exercise on the other side of your body. Repeat __________ times. Complete this exercise __________ times a  day. Single knee to chest  Lie on your back on a firm bed or the floor with both legs straight. Bend one of your knees. Use your hands to move your knee up toward your chest until you feel a gentle stretch in your lower back and buttock. Hold your leg in this position by holding on to the front of your knee. Keep your other leg as straight as possible. Hold this position for __________ seconds. Slowly return to the starting position. Repeat with your other leg. Repeat __________ times. Complete this exercise __________ times a day. Prone extension on elbows  Lie on your abdomen on a firm bed or the floor (prone position). Prop yourself up on your elbows. Use your arms to help lift your chest up until you feel a gentle stretch in your abdomen and your lower back. This will place some of your body weight on your elbows. If this is uncomfortable, try stacking pillows under your chest. Your hips should stay down, against the surface that you are lying on. Keep your hip and back muscles relaxed. Hold this position for __________ seconds. Slowly relax your upper body and return to the starting position. Repeat __________ times. Complete this exercise __________ times a day. Strengthening exercises These exercises build strength and endurance in your back. Endurance is the ability to use your muscles for a long time, even after they get tired. Pelvic tilt This exercise strengthens the muscles that lie deep in the abdomen. Lie on your back on a firm bed or the floor with your legs extended. Bend your knees so they are pointing toward the ceiling and your feet are flat on the floor. Tighten your lower abdominal muscles to press your lower back against the floor. This motion will tilt your pelvis so your tailbone points up toward the ceiling instead of pointing to your feet or the floor. To help with this exercise, you may place a small towel under your lower back and try to push your back into the  towel. Hold this position for __________ seconds. Let your muscles relax completely before you repeat this exercise. Repeat __________ times. Complete this exercise __________  times a day. Alternating arm and leg raises  Get on your hands and knees on a firm surface. If you are on a hard floor, you may want to use padding, such as an exercise mat, to cushion your knees. Line up your arms and legs. Your hands should be directly below your shoulders, and your knees should be directly below your hips. Lift your left leg behind you. At the same time, raise your right arm and straighten it in front of you. Do not lift your leg higher than your hip. Do not lift your arm higher than your shoulder. Keep your abdominal and back muscles tight. Keep your hips facing the ground. Do not arch your back. Keep your balance carefully, and do not hold your breath. Hold this position for __________ seconds. Slowly return to the starting position. Repeat with your right leg and your left arm. Repeat __________ times. Complete this exercise __________ times a day. Abdominal set with straight leg raise  Lie on your back on a firm bed or the floor. Bend one of your knees and keep your other leg straight. Tense your abdominal muscles and lift your straight leg up, 4-6 inches (10-15 cm) off the ground. Keep your abdominal muscles tight and hold this position for __________ seconds. Do not hold your breath. Do not arch your back. Keep it flat against the ground. Keep your abdominal muscles tense as you slowly lower your leg back to the starting position. Repeat with your other leg. Repeat __________ times. Complete this exercise __________ times a day. Single leg lower with bent knees Lie on your back on a firm bed or the floor. Tense your abdominal muscles and lift your feet off the floor, one foot at a time, so your knees and hips are bent in 90-degree angles (right angles). Your knees should be over your  hips and your lower legs should be parallel to the floor. Keeping your abdominal muscles tense and your knee bent, slowly lower one of your legs so your toe touches the ground. Lift your leg back up to return to the starting position. Do not hold your breath. Do not let your back arch. Keep your back flat against the ground. Repeat with your other leg. Repeat __________ times. Complete this exercise __________ times a day. Posture and body mechanics Good posture and healthy body mechanics can help to relieve stress in your body's tissues and joints. Body mechanics refers to the movements and positions of your body while you do your daily activities. Posture is part of body mechanics. Good posture means: Your spine is in its natural S-curve position (neutral). Your shoulders are pulled back slightly. Your head is not tipped forward (neutral). Follow these guidelines to improve your posture and body mechanics in your everyday activities. Standing  When standing, keep your spine neutral and your feet about hip-width apart. Keep a slight bend in your knees. Your ears, shoulders, and hips should line up. When you do a task in which you stand in one place for a long time, place one foot up on a stable object that is 2-4 inches (5-10 cm) high, such as a footstool. This helps keep your spine neutral. Sitting  When sitting, keep your spine neutral and keep your feet flat on the floor. Use a footrest, if necessary, and keep your thighs parallel to the floor. Avoid rounding your shoulders, and avoid tilting your head forward. When working at a desk or a [employer], keep your desk at a  height where your hands are slightly lower than your elbows. Slide your chair under your desk so you are close enough to maintain good posture. When working at a computer, place your monitor at a height where you are looking straight ahead and you do not have to tilt your head forward or downward to look at the  screen. Resting When lying down and resting, avoid positions that are most painful for you. If you have pain with activities such as sitting, bending, stooping, or squatting, lie in a position in which your body does not bend very much. For example, avoid curling up on your side with your arms and knees near your chest (fetal position). If you have pain with activities such as standing for a long time or reaching with your arms, lie with your spine in a neutral position and bend your knees slightly. Try the following positions: Lying on your side with a pillow between your knees. Lying on your back with a pillow under your knees. Lifting  When lifting objects, keep your feet at least shoulder-width apart and tighten your abdominal muscles. Bend your knees and hips and keep your spine neutral. It is important to lift using the strength of your legs, not your back. Do not lock your knees straight out. Always ask for help to lift heavy or awkward objects. This information is not intended to replace advice given to you by your health care provider. Make sure you discuss any questions you have with your health care provider. Document Revised: 09/20/2020 Document Reviewed: 09/20/2020 [employer] Patient Education  2024 [employer].

## 2023-01-03 DIAGNOSIS — M1711 Unilateral primary osteoarthritis, right knee: Secondary | ICD-10-CM | POA: Diagnosis not present

## 2023-01-10 NOTE — H&P (Signed)
KNEE ARTHROPLASTY ADMISSION H&P  Patient ID: Christina Villegas MRN: [id number] DOB/AGE: Dec 26, 1950 72 y.o.  Chief Complaint: right knee pain.  Planned Procedure Date: 01/30/23 Medical and Cardiac Clearance by Dr. Abe People     HPI: Christina Villegas is a 72 y.o. 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 The patient has a history of pain and functional disability in the right knee due to arthritis and has failed non-surgical conservative treatments for greater than 12 weeks to include NSAID's and/or analgesics, corticosteriod injections, use of assistive devices, and activity modification.  Onset of symptoms was gradual, starting 10 years ago with gradually worsening course since that time. The patient noted prior procedures on the knee to include  arthroscopy and menisectomy on the right knee.  Patient currently rates pain at 10 out of 10 with activity. Patient has night pain, worsening of pain with activity and weight bearing, and pain that interferes with activities of daily living.  Patient has evidence of subchondral sclerosis, periarticular osteophytes, and joint space narrowing by imaging studies.  There is no active infection.  Past Medical History:  Diagnosis Date   High cholesterol    Hypertension    Pre-diabetes    Past Surgical History:  Procedure Laterality Date   ABLATION Right    vein ablation- right leg   KNEE ARTHROSCOPY     rt side   WRIST SURGERY Left 05/2022   Allergies  Allergen Reactions   Hydrocodone Nausea Only   Prior to Admission medications   Medication Sig Start Date End Date Taking? Authorizing Provider  Cholecalciferol (VITAMIN D3) 1.25 MG (50000 UT) CAPS Take 1 capsule by mouth once a week. 09/06/22   [provider]  hydrochlorothiazide (HYDRODIURIL) 25 MG tablet Take 1 tablet by mouth daily. 01/14/19   [provider]  levothyroxine (SYNTHROID) 100 MCG tablet Take 100 mcg by mouth daily. 12/17/22   [provider]   lisinopril (PRINIVIL) 10 MG tablet Take 1 tablet (10 mg total) by mouth daily. Patient not taking: Reported on 12/26/2022 05/22/13   Jeralyn Bennett, MD  losartan (COZAAR) 100 MG tablet Take 1 tablet by mouth daily. 01/19/19   [provider]  pravastatin (PRAVACHOL) 40 MG tablet Take 40 mg by mouth daily.    [provider]  sertraline (ZOLOFT) 100 MG tablet Take 150 mg by mouth every morning.    [provider]  sertraline (ZOLOFT) 100 MG tablet Take 1 tablet by mouth daily. Patient not taking: Reported on 12/26/2022 01/14/19   [provider]   Social History   Socioeconomic History   Marital status: Married    Spouse name: Not on file   Number of children: Not on file   Years of education: Not on file   Highest education level: Not on file  Occupational History   Not on file  Tobacco Use   Smoking status: Former    Passive exposure: Current   Smokeless tobacco: Never  Vaping Use   Vaping Use: Never used  Substance and Sexual Activity   Alcohol use: No   Drug use: No   Sexual activity: Yes  Other Topics Concern   Not on file  Social History Narrative   Not on file   Social Determinants of Health   Financial Resource Strain: Not on file  Food Insecurity: Not on file  Transportation Needs: Not on file  Physical Activity: Not on file  Stress: Not on file  Social Connections: Not on file  Family History  Problem Relation Age of Onset   Heart failure Mother    Hypertension Mother    Heart failure Father    Hypertension Father    Heart attack Father    Diabetes Sister    Heart disease Sister    Healthy Son    Healthy Son     ROS: Currently denies lightheadedness, dizziness, Fever, chills, CP, SOB.   No personal history of DVT, PE, MI, or CVA. No natural teeth. Upper dentures present All other systems have been reviewed and were otherwise currently negative with the exception of those mentioned in the HPI and as  above.  Objective: Vitals: Ht: 5'3" Wt: 207 lbs Temp: 98.5 BP: 150/81 Pulse: 72 O2 93% on room air.   Physical Exam: General: Alert, NAD.  Antalgic Gait  HEENT: EOMI, Good Neck Extension  Pulm: No increased work of breathing.  Clear B/L A/P w/o crackle or wheeze.  CV: RRR, No m/g/r appreciated  GI: soft, NT, ND. BS x 4 quadrants Neuro: CN II-XII grossly intact without focal deficit.  Sensation intact distally Skin: No lesions in the area of chief complaint MSK/Surgical Site: + JLT. ROM slow from 5-95 degrees.  Decreased strength in extension and flexion.  +EHL/FHL.  NVI.  Stable varus and valgus stress.    Imaging Review Plain radiographs demonstrate severe degenerative joint disease of the right knee.   The overall alignment issignificant varus. The bone quality appears to be fair for age and reported activity level.  Preoperative templating of the joint replacement has been completed, documented, and submitted to the Operating Room personnel in order to optimize intra-operative equipment management.  Assessment: OA RIGHT KNEE Active Problems:   * No active hospital problems. *   Plan: Plan for Procedure(s): TOTAL KNEE ARTHROPLASTY  The patient history, physical exam, clinical judgement of the provider and imaging are consistent with end stage degenerative joint disease and total joint arthroplasty is deemed medically necessary. The treatment options including medical management, injection therapy, and arthroplasty were discussed at length. The risks and benefits of Procedure(s): TOTAL KNEE ARTHROPLASTY were presented and reviewed.  The risks of nonoperative treatment, versus surgical intervention including but not limited to continued pain, aseptic loosening, stiffness, dislocation/subluxation, infection, bleeding, nerve injury, blood clots, cardiopulmonary complications, morbidity, mortality, among others were discussed. The patient verbalizes understanding and wishes to proceed  with the plan.  Patient is being admitted for inpatient treatment for surgery, pain control, PT, prophylactic antibiotics, VTE prophylaxis, progressive ambulation, ADL's and discharge planning.     The patient does meet the criteria for TXA which will be used perioperatively.   ASA 81 mg BID will be used postoperatively for DVT prophylaxis in addition to SCDs, and early ambulation. Plan for Tylenol, Mobic, oxycodone for pain.   Robaxin for muscle spasms.   Zofran for nausea and vomiting. Senokot for constipation prevention Pharmacy- [facility] [place] The patient is planning to be discharged home with OPPT and into the care of her husband Casimiro Needle who can be reached at [phone] Follow up appt 02/14/23 at [facility] [place] Office [phone] 01/10/2023 6:04 PM

## 2023-01-22 NOTE — Patient Instructions (Signed)
DUE TO COVID-19 ONLY TWO VISITORS  (aged 73 and older)  ARE ALLOWED TO COME WITH YOU AND STAY IN THE WAITING ROOM ONLY DURING PRE OP AND PROCEDURE.   **NO VISITORS ARE ALLOWED IN THE SHORT STAY AREA OR RECOVERY ROOM!!**  IF YOU WILL BE ADMITTED INTO THE HOSPITAL YOU ARE ALLOWED ONLY FOUR SUPPORT PEOPLE DURING VISITATION HOURS ONLY (7 AM -8PM)   The support person(s) must pass our screening, gel in and out, and wear a mask at all times, including in the patient's room. Patients must also wear a mask when staff or their support person are in the room. Visitors GUEST BADGE MUST BE WORN VISIBLY  One adult visitor may remain with you overnight and MUST be in the room by 8 P.M.     Your procedure is scheduled on: 01/30/23   Report to [facility] Main Entrance    Report to admitting at : 7:00 AM   Call this number if you have problems the morning of surgery [phone]   Do not eat food :After Midnight.   After Midnight you may have the following liquids until : 6:30 AM DAY OF SURGERY  Water Black Coffee (sugar ok, NO MILK/CREAM OR CREAMERS)  Tea (sugar ok, NO MILK/CREAM OR CREAMERS) regular and decaf                             Plain Jell-O (NO RED)                                           Fruit ices (not with fruit pulp, NO RED)                                     Popsicles (NO RED)                                                                  Juice: apple, WHITE grape, WHITE cranberry Sports drinks like Gatorade (NO RED)   The day of surgery:  Drink ONE (1) Pre-Surgery Clear Ensure at : 6:30 AM the morning of surgery. Drink in one sitting. Do not sip.  This drink was given to you during your hospital  pre-op appointment visit. Nothing else to drink after completing the  Pre-Surgery Clear Ensure or G2.          If you have questions, please contact your surgeon's office.   Oral Hygiene is also important to reduce your risk of infection.                                     Remember - BRUSH YOUR TEETH THE MORNING OF SURGERY WITH YOUR REGULAR TOOTHPASTE  DENTURES WILL BE REMOVED PRIOR TO SURGERY PLEASE DO NOT APPLY "Poly grip" OR ADHESIVES!!!   Do NOT smoke after Midnight   Take these medicines the morning of surgery with A SIP OF WATER: sertraline,levothyroxine.  You may not have any metal on your body including hair pins, jewelry, and body piercing             Do not wear make-up, lotions, powders, perfumes/cologne, or deodorant  Do not wear nail polish including gel and S&S, artificial/acrylic nails, or any other type of covering on natural nails including finger and toenails. If you have artificial nails, gel coating, etc. that needs to be removed by a nail salon please have this removed prior to surgery or surgery may need to be canceled/ delayed if the surgeon/ anesthesia feels like they are unable to be safely monitored.   Do not shave  48 hours prior to surgery.    Do not bring valuables to the hospital. [facility] IS NOT             RESPONSIBLE   FOR VALUABLES.   Contacts, glasses, or bridgework may not be worn into surgery.   Bring small overnight bag day of surgery.   DO NOT BRING YOUR HOME MEDICATIONS TO THE HOSPITAL. PHARMACY WILL DISPENSE MEDICATIONS LISTED ON YOUR MEDICATION LIST TO YOU DURING YOUR ADMISSION IN THE HOSPITAL!    Patients discharged on the day of surgery will not be allowed to drive home.  Someone NEEDS to stay with you for the first 24 hours after anesthesia.   Special Instructions: Bring a copy of your healthcare power of attorney and living will documents         the day of surgery if you haven't scanned them before.              Please read over the following fact sheets you were given: IF YOU HAVE QUESTIONS ABOUT YOUR PRE-OP INSTRUCTIONS PLEASE CALL [phone]      Pre-operative 5 CHG Bath Instructions   You can play a key role in reducing the risk of infection after surgery. Your  skin needs to be as free of germs as possible. You can reduce the number of germs on your skin by washing with CHG (chlorhexidine gluconate) soap before surgery. CHG is an antiseptic soap that kills germs and continues to kill germs even after washing.   DO NOT use if you have an allergy to chlorhexidine/CHG or antibacterial soaps. If your skin becomes reddened or irritated, stop using the CHG and notify one of our RNs at : [phone].   Please shower with the CHG soap starting 4 days before surgery using the following schedule:     Please keep in mind the following:  DO NOT shave, including legs and underarms, starting the day of your first shower.   You may shave your face at any point before/day of surgery.  Place clean sheets on your bed the day you start using CHG soap. Use a clean washcloth (not used since being washed) for each shower. DO NOT sleep with pets once you start using the CHG.   CHG Shower Instructions:  If you choose to wash your hair and private area, wash first with your normal shampoo/soap.  After you use shampoo/soap, rinse your hair and body thoroughly to remove shampoo/soap residue.  Turn the water OFF and apply about 3 tablespoons (45 ml) of CHG soap to a CLEAN washcloth.  Apply CHG soap ONLY FROM YOUR NECK DOWN TO YOUR TOES (washing for 3-5 minutes)  DO NOT use CHG soap on face, private areas, open wounds, or sores.  Pay special attention to the area where your surgery is being performed.  If you are having back surgery, having someone wash your back for you may be helpful. Wait 2 minutes after CHG soap is applied, then you may rinse off the CHG soap.  Pat dry with a clean towel  Put on clean clothes/pajamas   If you choose to wear lotion, please use ONLY the CHG-compatible lotions on the back of this paper.     Additional instructions for the day of surgery: DO NOT APPLY any lotions, deodorants, cologne, or perfumes.   Put on clean/comfortable clothes.   Brush your teeth.  Ask your nurse before applying any prescription medications to the skin.      CHG Compatible Lotions   Aveeno Moisturizing lotion  Cetaphil Moisturizing Cream  Cetaphil Moisturizing Lotion  Clairol Herbal Essence Moisturizing Lotion, Dry Skin  Clairol Herbal Essence Moisturizing Lotion, Extra Dry Skin  Clairol Herbal Essence Moisturizing Lotion, Normal Skin  Curel Age Defying Therapeutic Moisturizing Lotion with Alpha Hydroxy  Curel Extreme Care Body Lotion  Curel Soothing Hands Moisturizing Hand Lotion  Curel Therapeutic Moisturizing Cream, Fragrance-Free  Curel Therapeutic Moisturizing Lotion, Fragrance-Free  Curel Therapeutic Moisturizing Lotion, Original Formula  Eucerin Daily Replenishing Lotion  Eucerin Dry Skin Therapy Plus Alpha Hydroxy Crme  Eucerin Dry Skin Therapy Plus Alpha Hydroxy Lotion  Eucerin Original Crme  Eucerin Original Lotion  Eucerin Plus Crme Eucerin Plus Lotion  Eucerin TriLipid Replenishing Lotion  Keri Anti-Bacterial Hand Lotion  Keri Deep Conditioning Original Lotion Dry Skin Formula Softly Scented  Keri Deep Conditioning Original Lotion, Fragrance Free Sensitive Skin Formula  Keri Lotion Fast Absorbing Fragrance Free Sensitive Skin Formula  Keri Lotion Fast Absorbing Softly Scented Dry Skin Formula  Keri Original Lotion  Keri Skin Renewal Lotion Keri Silky Smooth Lotion  Keri Silky Smooth Sensitive Skin Lotion  Nivea Body Creamy Conditioning Oil  Nivea Body Extra Enriched Lotion  Nivea Body Original Lotion  Nivea Body Sheer Moisturizing Lotion Nivea Crme  Nivea Skin Firming Lotion  NutraDerm 30 Skin Lotion  NutraDerm Skin Lotion  NutraDerm Therapeutic Skin Cream  NutraDerm Therapeutic Skin Lotion  ProShield Protective Hand Cream  Provon moisturizing lotion   Incentive Spirometer  An incentive spirometer is a tool that can help keep your lungs clear and active. This tool measures how well you are filling your lungs  with each breath. Taking long deep breaths may help reverse or decrease the chance of developing breathing (pulmonary) problems (especially infection) following: A long period of time when you are unable to move or be active. BEFORE THE PROCEDURE  If the spirometer includes an indicator to show your best effort, your nurse or respiratory therapist will set it to a desired goal. If possible, sit up straight or lean slightly forward. Try not to slouch. Hold the incentive spirometer in an upright position. INSTRUCTIONS FOR USE  Sit on the edge of your bed if possible, or sit up as far as you can in bed or on a chair. Hold the incentive spirometer in an upright position. Breathe out normally. Place the mouthpiece in your mouth and seal your lips tightly around it. Breathe in slowly and as deeply as possible, raising the piston or the ball toward the top of the column. Hold your breath for 3-5 seconds or for as long as possible. Allow the piston or ball to fall to the bottom of the column. Remove the mouthpiece from your mouth and breathe out normally. Rest for a few seconds and repeat Steps 1 through 7 at least 10 times every  1-2 hours when you are awake. Take your time and take a few normal breaths between deep breaths. The spirometer may include an indicator to show your best effort. Use the indicator as a goal to work toward during each repetition. After each set of 10 deep breaths, practice coughing to be sure your lungs are clear. If you have an incision (the cut made at the time of surgery), support your incision when coughing by placing a pillow or rolled up towels firmly against it. Once you are able to get out of bed, walk around indoors and cough well. You may stop using the incentive spirometer when instructed by your caregiver.  RISKS AND COMPLICATIONS Take your time so you do not get dizzy or light-headed. If you are in pain, you may need to take or ask for pain medication before doing  incentive spirometry. It is harder to take a deep breath if you are having pain. AFTER USE Rest and breathe slowly and easily. It can be helpful to keep track of a log of your progress. Your caregiver can provide you with a simple table to help with this. If you are using the spirometer at home, follow these instructions: SEEK MEDICAL CARE IF:  You are having difficultly using the spirometer. You have trouble using the spirometer as often as instructed. Your pain medication is not giving enough relief while using the spirometer. You develop fever of 100.5 F (38.1 C) or higher. SEEK IMMEDIATE MEDICAL CARE IF:  You cough up bloody sputum that had not been present before. You develop fever of 102 F (38.9 C) or greater. You develop worsening pain at or near the incision site. MAKE SURE YOU:  Understand these instructions. Will watch your condition. Will get help right away if you are not doing well or get worse. Document Released: 11/13/2006 Document Revised: 09/25/2011 Document Reviewed: 01/14/2007 [employer] Patient Information 2014 [place], [place].   ________________________________________________________________________

## 2023-01-23 ENCOUNTER — Encounter (HOSPITAL_COMMUNITY): Payer: Self-pay

## 2023-01-23 ENCOUNTER — Encounter (HOSPITAL_COMMUNITY)
Admission: RE | Admit: 2023-01-23 | Discharge: 2023-01-23 | Disposition: A | Discharge status: Home / Self Care | Payer: No Typology Code available for payment source | Source: Ambulatory Visit | Attending: Orthopedic Surgery | Admitting: Orthopedic Surgery

## 2023-01-23 ENCOUNTER — Ambulatory Visit: Payer: Medicare HMO | Admitting: Rheumatology

## 2023-01-23 ENCOUNTER — Other Ambulatory Visit: Payer: Self-pay

## 2023-01-23 VITALS — BP 171/87 | HR 64 | Temp 98.4°F | Ht 63.0 in | Wt 201.0 lb

## 2023-01-23 DIAGNOSIS — Z01818 Encounter for other preprocedural examination: Secondary | ICD-10-CM | POA: Diagnosis not present

## 2023-01-23 DIAGNOSIS — I10 Essential (primary) hypertension: Secondary | ICD-10-CM | POA: Diagnosis not present

## 2023-01-23 DIAGNOSIS — M1711 Unilateral primary osteoarthritis, right knee: Secondary | ICD-10-CM | POA: Insufficient documentation

## 2023-01-23 DIAGNOSIS — I119 Hypertensive heart disease without heart failure: Secondary | ICD-10-CM | POA: Insufficient documentation

## 2023-01-23 DIAGNOSIS — I340 Nonrheumatic mitral (valve) insufficiency: Secondary | ICD-10-CM | POA: Diagnosis not present

## 2023-01-23 DIAGNOSIS — I700 Atherosclerosis of aorta: Secondary | ICD-10-CM | POA: Diagnosis not present

## 2023-01-23 HISTORY — DX: Unspecified osteoarthritis, unspecified site: M19.90

## 2023-01-23 HISTORY — DX: Anxiety disorder, unspecified: F41.9

## 2023-01-23 HISTORY — DX: Palpitations: R00.2

## 2023-01-23 HISTORY — DX: Cardiac murmur, unspecified: R01.1

## 2023-01-23 HISTORY — DX: Dyspnea, unspecified: R06.00

## 2023-01-23 HISTORY — DX: Depression, unspecified: F32.A

## 2023-01-23 HISTORY — DX: Other complications of anesthesia, initial encounter: T88.59XA

## 2023-01-23 LAB — SURGICAL PCR SCREEN
MRSA, PCR: NEGATIVE
Staphylococcus aureus: NEGATIVE

## 2023-01-23 LAB — BASIC METABOLIC PANEL
Anion gap: 9 (ref 5–15)
BUN: 24 mg/dL — ABNORMAL HIGH (ref 8–23)
CO2: 26 mmol/L (ref 22–32)
Calcium: 9.4 mg/dL (ref 8.9–10.3)
Chloride: 103 mmol/L (ref 98–111)
Creatinine, Ser: 0.75 mg/dL (ref 0.44–1.00)
GFR, Estimated: >60 mL/min (ref >60)
Glucose, Bld: 103 mg/dL — ABNORMAL HIGH (ref 70–99)
Potassium: 4.1 mmol/L (ref 3.5–5.1)
Sodium: 138 mmol/L (ref 135–145)

## 2023-01-23 LAB — CBC
HCT: 41.6 % (ref 36.0–46.0)
Hemoglobin: 13.6 g/dL (ref 12.0–15.0)
MCH: 32.6 pg (ref 26.0–34.0)
MCHC: 32.7 g/dL (ref 30.0–36.0)
MCV: 99.8 fL (ref 80.0–100.0)
Platelets: 206 10*3/uL (ref 150–400)
RBC: 4.17 MIL/uL (ref 3.87–5.11)
RDW: 13.4 % (ref 11.5–15.5)
WBC: 8.3 10*3/uL (ref 4.0–10.5)
nRBC: 0 % (ref 0.0–0.2)

## 2023-01-23 NOTE — Progress Notes (Addendum)
For Short Stay: COVID SWAB appointment date:  Bowel Prep reminder:   For Anesthesia: PCP - Dr. Edilia Bo Cardiologist - Dr. Diamond Nickel [phone]) Clearance: Chart: Archie Endo: FNP: 12/17/22 Chest x-ray -  EKG - 01/23/23 Stress Test -  ECHO -  Cardiac Cath -  Pacemaker/ICD device last checked: Pacemaker orders received: Device Rep notified:  Spinal Cord Stimulator: n?a  Sleep Study - N/A CPAP -   Fasting Blood Sugar - N/A Checks Blood Sugar _____ times a day Date and result of last Hgb A1c-  Last dose of GLP1 agonist- N/A GLP1 instructions:   Last dose of SGLT-2 inhibitors- N/A SGLT-2 instructions:   Blood Thinner Instructions: N/A Aspirin Instructions: Last Dose:  Activity level: Can go up a flight of stairs and activities of daily living without stopping and without chest pain and/or shortness of breath   Unable to exercise without shortness of breath  Anesthesia review: Hx: HTN,Palpitations,Heart murmur,Pre-DIA  Patient denies shortness of breath, fever, cough and chest pain at PAT appointment   Patient verbalized understanding of instructions that were given to them at the PAT appointment. Patient was also instructed that they will need to review over the PAT instructions again at home before surgery.

## 2023-01-23 NOTE — Progress Notes (Signed)
   01/23/23 1038  OBSTRUCTIVE SLEEP APNEA  Have you ever been diagnosed with sleep apnea through a sleep study? No  Do you snore loudly (loud enough to be heard through closed doors)?  1  Do you often feel tired, fatigued, or sleepy during the daytime (such as falling asleep during driving or talking to someone)? 1  Has anyone observed you stop breathing during your sleep? 0  Do you have, or are you being treated for high blood pressure? 1  BMI more than 35 kg/m2? 1  Age > 50 (1-yes) 1  Neck circumference greater than:Female 16 inches or larger, Female 17inches or larger? 0  Female Gender (Yes=1) 0  Obstructive Sleep Apnea Score 5  Score 5 or greater  Results sent to PCP

## 2023-01-24 NOTE — Progress Notes (Signed)
Anesthesia Chart Review   Case: [id number] Date/Time: 01/30/23 0915   Procedure: TOTAL KNEE ARTHROPLASTY (Right: Knee)   Anesthesia type: Spinal   Pre-op diagnosis: OA RIGHT KNEE   Location: [facility] ROOM 08 / [facility]   Surgeons: Sheral Apley, MD       DISCUSSION:72 y.o. former smoker with h/o HTN, right knee OA scheduled for above procedure 01/30/2023 with Dr. Margarita Rana.   Clearance from PCP on chart which states pt is cleared from medical and cardiac standpoint.  VS: BP (!) 171/87 Comment: pt. has not take BP medicines  Pulse 64   Temp 36.9 C (Oral)   Ht 5\' 3"  (1.6 m)   Wt 91.2 kg   SpO2 100%   BMI 35.61 kg/m   PROVIDERS: Hamrick, Durward Fortes, MD is PCP    LABS: Labs reviewed: Acceptable for surgery. (all labs ordered are listed, but only abnormal results are displayed)  Labs Reviewed  BASIC METABOLIC PANEL - Abnormal; Notable for the following components:      Result Value   Glucose, Bld 103 (*)    BUN 24 (*)    All other components within normal limits  SURGICAL PCR SCREEN  CBC     IMAGES: CT Angiography 06/21/2020 IMPRESSION:  1. Negative for large vessel occlusion. No acute intracranial  abnormality.  2. Bilateral carotid atherosclerosis without hemodynamically  significant stenosis.  3. Mild posterior circulation atherosclerosis. Dominant left  vertebral artery which supplies the basilar and arises directly from  the aortic arch.  4. 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   EKG:   CV: Echo 04/11/2019 Summary  Borderline concentric left ventricular hypertrophy  Normal left ventricular systolic function with no segmental wall motion  abnormality  Ejection fraction is visually estimated at 60-65%  Diastolic function Appears normal  There is mild aortic sclerosis noted, with no evidence of stenosis.  Mild mitral regurgitation.  Past Medical History:  Diagnosis Date   Anxiety    Arthritis    Complication of anesthesia    dizziness    Depression    Dyspnea    Heart murmur    High cholesterol    Hypertension    Palpitations    Pre-diabetes     Past Surgical History:  Procedure Laterality Date   ABLATION Right    vein ablation- right leg   CATARACT EXTRACTION Bilateral    KNEE ARTHROSCOPY     rt side   WRIST SURGERY Left 05/2022    MEDICATIONS:  Cholecalciferol (VITAMIN D3) 1.25 MG (50000 UT) CAPS   hydrochlorothiazide (HYDRODIURIL) 25 MG tablet   ibuprofen (ADVIL) 800 MG tablet   levothyroxine (SYNTHROID) 100 MCG tablet   losartan (COZAAR) 100 MG tablet   pravastatin (PRAVACHOL) 40 MG tablet   sertraline (ZOLOFT) 100 MG tablet   No current facility-administered medications for this encounter.    Jodell Cipro Ward, PA-C [facility] Pre-Surgical Testing [phone]

## 2023-01-27 DIAGNOSIS — R3915 Urgency of urination: Secondary | ICD-10-CM | POA: Diagnosis not present

## 2023-01-27 DIAGNOSIS — R300 Dysuria: Secondary | ICD-10-CM | POA: Diagnosis not present

## 2023-01-28 ENCOUNTER — Emergency Department (HOSPITAL_BASED_OUTPATIENT_CLINIC_OR_DEPARTMENT_OTHER): Payer: No Typology Code available for payment source

## 2023-01-28 ENCOUNTER — Emergency Department (HOSPITAL_BASED_OUTPATIENT_CLINIC_OR_DEPARTMENT_OTHER)
Admission: EM | Admit: 2023-01-28 | Discharge: 2023-01-28 | Disposition: A | Discharge status: Home / Self Care | Payer: No Typology Code available for payment source | Attending: Emergency Medicine | Admitting: Emergency Medicine

## 2023-01-28 ENCOUNTER — Other Ambulatory Visit: Payer: Self-pay

## 2023-01-28 ENCOUNTER — Encounter (HOSPITAL_BASED_OUTPATIENT_CLINIC_OR_DEPARTMENT_OTHER): Payer: Self-pay

## 2023-01-28 DIAGNOSIS — Z79899 Other long term (current) drug therapy: Secondary | ICD-10-CM | POA: Insufficient documentation

## 2023-01-28 DIAGNOSIS — R110 Nausea: Secondary | ICD-10-CM | POA: Diagnosis not present

## 2023-01-28 DIAGNOSIS — R109 Unspecified abdominal pain: Secondary | ICD-10-CM | POA: Insufficient documentation

## 2023-01-28 DIAGNOSIS — I10 Essential (primary) hypertension: Secondary | ICD-10-CM | POA: Insufficient documentation

## 2023-01-28 LAB — BASIC METABOLIC PANEL
Anion gap: 11 (ref 5–15)
BUN: 32 mg/dL — ABNORMAL HIGH (ref 8–23)
CO2: 22 mmol/L (ref 22–32)
Calcium: 9.1 mg/dL (ref 8.9–10.3)
Chloride: 103 mmol/L (ref 98–111)
Creatinine, Ser: 1.19 mg/dL — ABNORMAL HIGH (ref 0.44–1.00)
GFR, Estimated: 48 mL/min — ABNORMAL LOW (ref >60)
Glucose, Bld: 105 mg/dL — ABNORMAL HIGH (ref 70–99)
Potassium: 4.4 mmol/L (ref 3.5–5.1)
Sodium: 136 mmol/L (ref 135–145)

## 2023-01-28 LAB — URINALYSIS, ROUTINE W REFLEX MICROSCOPIC
Bilirubin Urine: NEGATIVE
Glucose, UA: NEGATIVE mg/dL
Ketones, ur: NEGATIVE mg/dL
Leukocytes,Ua: NEGATIVE
Nitrite: NEGATIVE
Protein, ur: NEGATIVE mg/dL
Specific Gravity, Urine: 1.015 (ref 1.005–1.030)
pH: 5.5 (ref 5.0–8.0)

## 2023-01-28 LAB — CBC WITH DIFFERENTIAL/PLATELET
Abs Immature Granulocytes: 0.02 10*3/uL (ref 0.00–0.07)
Basophils Absolute: 0.1 10*3/uL (ref 0.0–0.1)
Basophils Relative: 1 %
Eosinophils Absolute: 1.1 10*3/uL — ABNORMAL HIGH (ref 0.0–0.5)
Eosinophils Relative: 14 %
HCT: 38.6 % (ref 36.0–46.0)
Hemoglobin: 12.7 g/dL (ref 12.0–15.0)
Immature Granulocytes: 0 %
Lymphocytes Relative: 29 %
Lymphs Abs: 2.4 10*3/uL (ref 0.7–4.0)
MCH: 32.2 pg (ref 26.0–34.0)
MCHC: 32.9 g/dL (ref 30.0–36.0)
MCV: 98 fL (ref 80.0–100.0)
Monocytes Absolute: 0.7 10*3/uL (ref 0.1–1.0)
Monocytes Relative: 9 %
Neutro Abs: 3.9 10*3/uL (ref 1.7–7.7)
Neutrophils Relative %: 47 %
Platelets: 162 10*3/uL (ref 150–400)
RBC: 3.94 MIL/uL (ref 3.87–5.11)
RDW: 13.4 % (ref 11.5–15.5)
WBC: 8.2 10*3/uL (ref 4.0–10.5)
nRBC: 0 % (ref 0.0–0.2)

## 2023-01-28 LAB — URINALYSIS, MICROSCOPIC (REFLEX)

## 2023-01-28 MED ORDER — SODIUM CHLORIDE 0.9 % IV SOLN: INTRAVENOUS | Status: DC | PRN

## 2023-01-28 MED ORDER — SODIUM CHLORIDE 0.9 % IV SOLN
2.0000 g | Freq: Once | INTRAVENOUS | Status: AC
  Administered 2023-01-28: 2 g via INTRAVENOUS
  Filled 2023-01-28: qty 20

## 2023-01-28 MED ORDER — OXYCODONE-ACETAMINOPHEN 5-325 MG PO TABS
1.0000 | ORAL_TABLET | Freq: Once | ORAL | Status: AC
  Administered 2023-01-28: 1 via ORAL
  Filled 2023-01-28: qty 1

## 2023-01-28 MED ORDER — CEFPODOXIME PROXETIL 200 MG PO TABS: 200.0000 mg | ORAL_TABLET | Freq: Two times a day (BID) | ORAL | 0 refills | Status: AC

## 2023-01-28 MED ORDER — SODIUM CHLORIDE 0.9 % IV BOLUS
1000.0000 mL | Freq: Once | INTRAVENOUS | Status: AC
  Administered 2023-01-28: 1000 mL via INTRAVENOUS

## 2023-01-28 MED ORDER — KETOROLAC TROMETHAMINE 10 MG PO TABS: 10.0000 mg | ORAL_TABLET | Freq: Four times a day (QID) | ORAL | 0 refills | Status: DC | PRN

## 2023-01-28 NOTE — Discharge Instructions (Addendum)
It was a pleasure taking part in your care today.  As we discussed, I believe that you have a kidney infection which the doctor urgent care also believed.  I have changed her antibiotic to cefpodoxime.  Please do not take any more than nitrofurantoin.  You will begin taking cefpodoxime for the next 10 days twice daily beginning on 7/15.  I would like for you to also increase your fluid intake.  You may take pain medication, Toradol, every 6 hours as needed for pain. Please follow-up with your primary care doctor for reevaluation.  Please return to the ED with any new or worsening signs or symptoms.

## 2023-01-28 NOTE — ED Triage Notes (Signed)
Pt arrives with c/o right sided flank pain that started yesterday. Pt seen at [facility] and diagnosed with a kidney infection. Per pt, pain has worsened since this morning. Pt endorses nausea.

## 2023-01-28 NOTE — ED Provider Notes (Addendum)
[facility] Provider Note   CSN: [id number] Arrival date & time: 01/28/23  1910     History  Chief Complaint  Patient presents with   Flank Pain    Christina Villegas is a 72 y.o. female with medical history of anxiety, depression, heart murmur, hypertension.  Patient presents to ED for evaluation of right flank pain.  The patient reports beginning on Monday she has had waxing waning right flank pain.  She reports that she feels as if she needs to urinate sometimes but only produces a small amount of urine.  She denies any overt dysuria, vaginal discharge.  She endorses nausea without vomiting.  She denies diarrhea, chest pain or shortness of breath.  She was seen at an urgent care on Friday where she was diagnosed with a kidney infection and provided Macrobid, Voltaren pills as well as muscle relaxers.  Patient states that these medications do not been relieving her pain as anticipated.  Denies fevers at home.   Flank Pain Pertinent negatives include no abdominal pain.       Home Medications Prior to Admission medications   Medication Sig Start Date End Date Taking? Authorizing Provider  cefpodoxime (VANTIN) 200 MG tablet Take 1 tablet (200 mg total) by mouth 2 (two) times daily. 01/28/23  Yes Al Decant, PA-C  ketorolac (TORADOL) 10 MG tablet Take 1 tablet (10 mg total) by mouth every 6 (six) hours as needed. 01/28/23  Yes Al Decant, PA-C  Cholecalciferol (VITAMIN D3) 1.25 MG (50000 UT) CAPS Take 50,000 Units by mouth every Wednesday. 09/06/22   [provider]  hydrochlorothiazide (HYDRODIURIL) 25 MG tablet Take 25 mg by mouth in the morning. 01/14/19   [provider]  ibuprofen (ADVIL) 800 MG tablet Take 800 mg by mouth at bedtime as needed (knee pain/swelling).    [provider]  levothyroxine (SYNTHROID) 100 MCG tablet Take 100 mcg by mouth daily before breakfast. 12/17/22   [provider]  losartan (COZAAR) 100 MG tablet Take 100 mg by mouth in the morning. 01/19/19   [provider]  pravastatin (PRAVACHOL) 40 MG tablet Take 40 mg by mouth every evening.    [provider]  sertraline (ZOLOFT) 100 MG tablet Take 100-200 mg by mouth in the morning.    [provider]      Allergies    Hydrocodone    Review of Systems   Review of Systems  Constitutional:  Negative for fever.  Gastrointestinal:  Positive for nausea. Negative for abdominal pain, diarrhea and vomiting.  Genitourinary:  Positive for dysuria and flank pain.  All other systems reviewed and are negative.   Physical Exam Updated Vital Signs BP (!) 151/74   Pulse 72   Temp 99 F (37.2 C)   Resp 18   Wt 91.2 kg   SpO2 97%   BMI 35.61 kg/m  Physical Exam Vitals and nursing note reviewed.  Constitutional:      General: She is not in acute distress.    Appearance: She is well-developed.  HENT:     Head: Normocephalic and atraumatic.     Mouth/Throat:     Pharynx: Oropharynx is clear.  Eyes:     Conjunctiva/sclera: Conjunctivae normal.  Cardiovascular:     Rate and Rhythm: Normal rate and regular rhythm.     Heart sounds: No murmur heard. Pulmonary:     Effort: Pulmonary effort is normal. No respiratory distress.  Breath sounds: Normal breath sounds.  Abdominal:     Palpations: Abdomen is soft.     Tenderness: There is no abdominal tenderness. There is right CVA tenderness. There is no left CVA tenderness.  Musculoskeletal:        General: No swelling.     Cervical back: Neck supple.  Skin:    General: Skin is warm and dry.     Capillary Refill: Capillary refill takes less than 2 seconds.  Neurological:     Mental Status: She is alert.  Psychiatric:        Mood and Affect: Mood normal.     ED Results / Procedures / Treatments   Labs (all labs ordered are listed, but only abnormal results are displayed) Labs Reviewed  CBC WITH  DIFFERENTIAL/PLATELET - Abnormal; Notable for the following components:      Result Value   Eosinophils Absolute 1.1 (*)    All other components within normal limits  BASIC METABOLIC PANEL - Abnormal; Notable for the following components:   Glucose, Bld 105 (*)    BUN 32 (*)    Creatinine, Ser 1.19 (*)    GFR, Estimated 48 (*)    All other components within normal limits  URINALYSIS, ROUTINE W REFLEX MICROSCOPIC - Abnormal; Notable for the following components:   Color, Urine STRAW (*)    Hgb urine dipstick TRACE (*)    All other components within normal limits  URINALYSIS, MICROSCOPIC (REFLEX) - Abnormal; Notable for the following components:   Bacteria, UA RARE (*)    All other components within normal limits  URINE CULTURE    EKG None  Radiology CT Renal Stone Study  Result Date: 01/28/2023 CLINICAL DATA:  Abdominal/flank pain, stone suspected. Right-sided flank pain. EXAM: CT ABDOMEN AND PELVIS WITHOUT CONTRAST TECHNIQUE: Multidetector CT imaging of the abdomen and pelvis was performed following the standard protocol without IV contrast. RADIATION DOSE REDUCTION: This exam was performed according to the departmental dose-optimization program which includes automated exposure control, adjustment of the mA and/or kV according to patient size and/or use of iterative reconstruction technique. COMPARISON:  CT chest abdomen and pelvis report from 06/03/2022 FINDINGS: Lower chest: No acute abnormality. Hepatobiliary: Unremarkable. Pancreas: Unremarkable. Spleen: Unremarkable. Adrenals/Urinary Tract: Normal adrenal glands. No urinary calculi or hydronephrosis. Unremarkable bladder. Stomach/Bowel: Normal caliber large and small bowel. No bowel wall thickening. The appendix is not visualized. No secondary signs of appendicitis. Stomach is within normal limits. Vascular/Lymphatic: Aortic atherosclerosis. No enlarged abdominal or pelvic lymph nodes. Reproductive: Uterus and bilateral adnexa are  unremarkable. Other: No free intraperitoneal fluid or air. Musculoskeletal: No acute fracture or destructive osseous lesion. Thoracolumbar spondylosis. IMPRESSION: 1. No acute abnormality in the abdomen or pelvis. Aortic Atherosclerosis (ICD10-I70.0). Electronically Signed   By: Minerva Fester M.D.   On: 01/28/2023 21:01    Procedures Procedures   Medications Ordered in ED Medications  0.9 %  sodium chloride infusion ( Intravenous New Bag/Given 01/28/23 2239)  oxyCODONE-acetaminophen (PERCOCET/ROXICET) 5-325 MG per tablet 1 tablet (1 tablet Oral Given 01/28/23 1949)  sodium chloride 0.9 % bolus 1,000 mL ( Intravenous Stopped 01/28/23 2121)  cefTRIAXone (ROCEPHIN) 2 g in sodium chloride 0.9 % 100 mL IVPB (2 g Intravenous New Bag/Given 01/28/23 2241)    ED Course/ Medical Decision Making/ A&P    Medical Decision Making Amount and/or Complexity of Data Reviewed Labs: ordered. Radiology: ordered.  Risk Prescription drug management.   72 year old female presents ED for evaluation.  Please see HPI for further details.  On examination the patient is afebrile and nontachycardic.  Her lung sounds are clear bilaterally and she is not hypoxic.  She does have associated right-sided CVA tenderness and no left-sided CVA tenderness.  Her abdomen is soft and compressible.  Neurological examination is at baseline.  CBC shows no leukocytosis or anemia.  Metabolic panel shows a creatinine of 1.19 which is increased from creatinine 5 days ago which showed 0.75.  Patient given 1 L fluid for this.  Patient urinalysis shows trace hemoglobin.  Patient urine will be cultured.  CT renal stone study shows no evidence of nephrolithiasis.  At this time, after receiving oxycodone, the patient reports he feels better.  The patient will receive 2 g of Rocephin for suspected kidney infection and then be discharged with cefpodoxime which she will take twice daily for 10 days.  She will follow-up with her PCP for  reevaluation.  She will return to the ED with any new or worsening signs or symptoms.  Patient case discussed with attending Dr. Jacqulyn Bath who voices agreement with plan of management.    Final Clinical Impression(s) / ED Diagnoses Final diagnoses:  Right flank pain    Rx / DC Orders ED Discharge Orders          Ordered    cefpodoxime (VANTIN) 200 MG tablet  2 times daily        01/28/23 2318    ketorolac (TORADOL) 10 MG tablet  Every 6 hours PRN        01/28/23 2318                  Al Decant, PA-C 01/28/23 2318    Maia Plan, MD 01/31/23 1128

## 2023-01-29 ENCOUNTER — Telehealth (HOSPITAL_BASED_OUTPATIENT_CLINIC_OR_DEPARTMENT_OTHER): Payer: Self-pay | Admitting: Emergency Medicine

## 2023-01-29 MED ORDER — MELOXICAM 15 MG PO TABS: 15.0000 mg | ORAL_TABLET | Freq: Every day | ORAL | 0 refills | Status: AC

## 2023-01-29 MED ORDER — CEFDINIR 300 MG PO CAPS: 300.0000 mg | ORAL_CAPSULE | Freq: Two times a day (BID) | ORAL | 0 refills | Status: AC

## 2023-01-29 NOTE — Telephone Encounter (Signed)
Received a call from the pharmacy.  They are reportedly unable to prescribe Toradol without first receiving an injection of the same.  Also do not have Vantin in stock.  Will change to [facility].  Mobic.

## 2023-01-30 LAB — URINE CULTURE: Culture: NO GROWTH

## 2023-02-06 ENCOUNTER — Encounter (HOSPITAL_COMMUNITY): Admission: RE | Admit: 2023-02-06 | Payer: No Typology Code available for payment source | Source: Ambulatory Visit

## 2023-02-07 NOTE — Progress Notes (Signed)
Attempted to obtain medical history via telephone, unable to reach at this time. HIPAA compliant voicemail message left requesting return call to pre surgical testing department. 

## 2023-02-09 NOTE — Progress Notes (Signed)
Pt. Called with updated surgery time. Pt. VU to arrive at 10:00 am to admitting. Clear liquids including preop drink until 0930 am then nothing by mouth. And to start preop showers today. Pt. Also reported when asked she was feeling better from her recent infection and   was treated with antibiotics.

## 2023-02-13 ENCOUNTER — Ambulatory Visit (HOSPITAL_COMMUNITY): Payer: No Typology Code available for payment source

## 2023-02-13 ENCOUNTER — Encounter (HOSPITAL_COMMUNITY): Payer: Self-pay | Admitting: Orthopedic Surgery

## 2023-02-13 ENCOUNTER — Encounter (HOSPITAL_COMMUNITY)
Admission: RE | Disposition: A | Discharge status: Home / Self Care | Payer: Self-pay | Source: Home / Self Care | Attending: Orthopedic Surgery

## 2023-02-13 ENCOUNTER — Ambulatory Visit (HOSPITAL_COMMUNITY): Payer: No Typology Code available for payment source | Admitting: Physician Assistant

## 2023-02-13 ENCOUNTER — Ambulatory Visit (HOSPITAL_COMMUNITY): Payer: No Typology Code available for payment source | Admitting: Anesthesiology

## 2023-02-13 ENCOUNTER — Ambulatory Visit (HOSPITAL_COMMUNITY)
Admission: RE | Admit: 2023-02-13 | Discharge: 2023-02-13 | Disposition: A | Discharge status: Home / Self Care | Payer: No Typology Code available for payment source | Attending: Orthopedic Surgery | Admitting: Orthopedic Surgery

## 2023-02-13 ENCOUNTER — Other Ambulatory Visit: Payer: Self-pay

## 2023-02-13 DIAGNOSIS — M1990 Unspecified osteoarthritis, unspecified site: Secondary | ICD-10-CM | POA: Insufficient documentation

## 2023-02-13 DIAGNOSIS — Z8249 Family history of ischemic heart disease and other diseases of the circulatory system: Secondary | ICD-10-CM | POA: Diagnosis not present

## 2023-02-13 DIAGNOSIS — Z96651 Presence of right artificial knee joint: Secondary | ICD-10-CM | POA: Diagnosis not present

## 2023-02-13 DIAGNOSIS — I10 Essential (primary) hypertension: Secondary | ICD-10-CM | POA: Diagnosis not present

## 2023-02-13 DIAGNOSIS — G8918 Other acute postprocedural pain: Secondary | ICD-10-CM | POA: Diagnosis not present

## 2023-02-13 DIAGNOSIS — M1711 Unilateral primary osteoarthritis, right knee: Secondary | ICD-10-CM | POA: Diagnosis not present

## 2023-02-13 DIAGNOSIS — Z79899 Other long term (current) drug therapy: Secondary | ICD-10-CM | POA: Insufficient documentation

## 2023-02-13 DIAGNOSIS — Z471 Aftercare following joint replacement surgery: Secondary | ICD-10-CM | POA: Diagnosis not present

## 2023-02-13 DIAGNOSIS — Z87891 Personal history of nicotine dependence: Secondary | ICD-10-CM | POA: Insufficient documentation

## 2023-02-13 DIAGNOSIS — M25761 Osteophyte, right knee: Secondary | ICD-10-CM | POA: Diagnosis not present

## 2023-02-13 HISTORY — PX: TOTAL KNEE ARTHROPLASTY: SHX125

## 2023-02-13 LAB — BASIC METABOLIC PANEL
Anion gap: 10 (ref 5–15)
BUN: 18 mg/dL (ref 8–23)
CO2: 26 mmol/L (ref 22–32)
Calcium: 9.2 mg/dL (ref 8.9–10.3)
Chloride: 102 mmol/L (ref 98–111)
Creatinine, Ser: 0.73 mg/dL (ref 0.44–1.00)
GFR, Estimated: >60 mL/min (ref >60)
Glucose, Bld: 104 mg/dL — ABNORMAL HIGH (ref 70–99)
Potassium: 4.1 mmol/L (ref 3.5–5.1)
Sodium: 138 mmol/L (ref 135–145)

## 2023-02-13 SURGERY — ARTHROPLASTY, KNEE, TOTAL
Anesthesia: Monitor Anesthesia Care | Site: Knee | Laterality: Right

## 2023-02-13 MED ORDER — SODIUM CHLORIDE (PF) 0.9 % IJ SOLN
INTRAMUSCULAR | Status: AC
  Filled 2023-02-13: qty 50

## 2023-02-13 MED ORDER — 0.9 % SODIUM CHLORIDE (POUR BTL) OPTIME
TOPICAL | Status: DC | PRN
  Administered 2023-02-13: 1000 mL

## 2023-02-13 MED ORDER — PROPOFOL 500 MG/50ML IV EMUL
INTRAVENOUS | Status: DC | PRN
  Administered 2023-02-13: 100 ug/kg/min via INTRAVENOUS

## 2023-02-13 MED ORDER — ACETAMINOPHEN 500 MG PO TABS: 1000.0000 mg | ORAL_TABLET | Freq: Four times a day (QID) | ORAL | 0 refills | Status: AC | PRN

## 2023-02-13 MED ORDER — SENNA-DOCUSATE SODIUM 8.6-50 MG PO TABS: 2.0000 | ORAL_TABLET | Freq: Every day | ORAL | 1 refills | Status: AC | PRN

## 2023-02-13 MED ORDER — ASPIRIN 81 MG PO TBEC: 81.0000 mg | DELAYED_RELEASE_TABLET | Freq: Two times a day (BID) | ORAL | 0 refills | Status: AC

## 2023-02-13 MED ORDER — BUPIVACAINE LIPOSOME 1.3 % IJ SUSP
INTRAMUSCULAR | Status: DC | PRN
  Administered 2023-02-13: 20 mL

## 2023-02-13 MED ORDER — CEFAZOLIN SODIUM-DEXTROSE 2-4 GM/100ML-% IV SOLN
2.0000 g | INTRAVENOUS | Status: AC
  Administered 2023-02-13: 2 g via INTRAVENOUS
  Filled 2023-02-13: qty 100

## 2023-02-13 MED ORDER — TRANEXAMIC ACID-NACL 1000-0.7 MG/100ML-% IV SOLN: 1000.0000 mg | Freq: Once | INTRAVENOUS | Status: DC

## 2023-02-13 MED ORDER — DEXAMETHASONE SODIUM PHOSPHATE 10 MG/ML IJ SOLN
8.0000 mg | Freq: Once | INTRAMUSCULAR | Status: AC
  Administered 2023-02-13: 4 mg via INTRAVENOUS

## 2023-02-13 MED ORDER — BUPIVACAINE LIPOSOME 1.3 % IJ SUSP
INTRAMUSCULAR | Status: AC
  Filled 2023-02-13: qty 20

## 2023-02-13 MED ORDER — POVIDONE-IODINE 10 % EX SWAB: 2.0000 | Freq: Once | CUTANEOUS | Status: DC

## 2023-02-13 MED ORDER — BUPIVACAINE-EPINEPHRINE 0.25% -1:200000 IJ SOLN
INTRAMUSCULAR | Status: DC | PRN
  Administered 2023-02-13: 30 mL

## 2023-02-13 MED ORDER — OXYCODONE HCL 5 MG/5ML PO SOLN: 5.0000 mg | Freq: Once | ORAL | Status: AC | PRN

## 2023-02-13 MED ORDER — CEFAZOLIN SODIUM-DEXTROSE 2-4 GM/100ML-% IV SOLN: 2.0000 g | Freq: Four times a day (QID) | INTRAVENOUS | Status: DC

## 2023-02-13 MED ORDER — LACTATED RINGERS IV BOLUS: 250.0000 mL | Freq: Once | INTRAVENOUS | Status: DC

## 2023-02-13 MED ORDER — CHLORHEXIDINE GLUCONATE 0.12 % MT SOLN
15.0000 mL | Freq: Once | OROMUCOSAL | Status: AC
  Administered 2023-02-13: 15 mL via OROMUCOSAL

## 2023-02-13 MED ORDER — PHENYLEPHRINE HCL-NACL 20-0.9 MG/250ML-% IV SOLN
INTRAVENOUS | Status: DC | PRN
  Administered 2023-02-13: 40 ug/min via INTRAVENOUS

## 2023-02-13 MED ORDER — SODIUM CHLORIDE 0.9 % IR SOLN
Status: DC | PRN
  Administered 2023-02-13: 1000 mL

## 2023-02-13 MED ORDER — SODIUM CHLORIDE 0.9% FLUSH
INTRAVENOUS | Status: DC | PRN
  Administered 2023-02-13: 30 mL

## 2023-02-13 MED ORDER — OXYCODONE HCL 5 MG PO TABS: 5.0000 mg | ORAL_TABLET | ORAL | 0 refills | Status: AC | PRN

## 2023-02-13 MED ORDER — DEXMEDETOMIDINE HCL IN NACL 80 MCG/20ML IV SOLN
INTRAVENOUS | Status: DC | PRN
Start: 2023-02-13 — End: 2023-02-13
  Administered 2023-02-13: 4 ug via INTRAVENOUS

## 2023-02-13 MED ORDER — OXYCODONE HCL 5 MG PO TABS
5.0000 mg | ORAL_TABLET | Freq: Once | ORAL | Status: AC | PRN
  Administered 2023-02-13: 5 mg via ORAL

## 2023-02-13 MED ORDER — LACTATED RINGERS IV BOLUS: 500.0000 mL | Freq: Once | INTRAVENOUS | Status: DC

## 2023-02-13 MED ORDER — FENTANYL CITRATE PF 50 MCG/ML IJ SOSY
50.0000 ug | PREFILLED_SYRINGE | INTRAMUSCULAR | Status: DC
  Administered 2023-02-13: 50 ug via INTRAVENOUS
  Filled 2023-02-13: qty 2

## 2023-02-13 MED ORDER — PHENYLEPHRINE HCL (PRESSORS) 10 MG/ML IV SOLN
INTRAVENOUS | Status: AC
  Filled 2023-02-13: qty 1

## 2023-02-13 MED ORDER — AMISULPRIDE (ANTIEMETIC) 5 MG/2ML IV SOLN: 10.0000 mg | Freq: Once | INTRAVENOUS | Status: DC | PRN

## 2023-02-13 MED ORDER — METHOCARBAMOL 500 MG PO TABS: 500.0000 mg | ORAL_TABLET | Freq: Four times a day (QID) | ORAL | Status: DC | PRN

## 2023-02-13 MED ORDER — OXYCODONE HCL 5 MG PO TABS
ORAL_TABLET | ORAL | Status: AC
  Filled 2023-02-13: qty 1

## 2023-02-13 MED ORDER — HYDROMORPHONE HCL 1 MG/ML IJ SOLN: 0.2500 mg | INTRAMUSCULAR | Status: DC | PRN

## 2023-02-13 MED ORDER — ROPIVACAINE HCL 5 MG/ML IJ SOLN
INTRAMUSCULAR | Status: DC | PRN
  Administered 2023-02-13: 20 mL via PERINEURAL

## 2023-02-13 MED ORDER — ACETAMINOPHEN 500 MG PO TABS
1000.0000 mg | ORAL_TABLET | Freq: Once | ORAL | Status: AC
  Administered 2023-02-13: 1000 mg via ORAL
  Filled 2023-02-13: qty 2

## 2023-02-13 MED ORDER — METHOCARBAMOL 500 MG IVPB - SIMPLE MED: 500.0000 mg | Freq: Four times a day (QID) | INTRAVENOUS | Status: DC | PRN

## 2023-02-13 MED ORDER — WATER FOR IRRIGATION, STERILE IR SOLN
Status: DC | PRN
  Administered 2023-02-13: 2000 mL

## 2023-02-13 MED ORDER — FENTANYL CITRATE (PF) 100 MCG/2ML IJ SOLN
INTRAMUSCULAR | Status: DC | PRN
  Administered 2023-02-13: 20 ug via INTRAVENOUS

## 2023-02-13 MED ORDER — TRANEXAMIC ACID-NACL 1000-0.7 MG/100ML-% IV SOLN
1000.0000 mg | INTRAVENOUS | Status: AC
  Administered 2023-02-13: 1000 mg via INTRAVENOUS
  Filled 2023-02-13: qty 100

## 2023-02-13 MED ORDER — ONDANSETRON HCL 4 MG/2ML IJ SOLN: 4.0000 mg | Freq: Once | INTRAMUSCULAR | Status: DC | PRN

## 2023-02-13 MED ORDER — ONDANSETRON HCL 4 MG/2ML IJ SOLN
INTRAMUSCULAR | Status: DC | PRN
  Administered 2023-02-13: 4 mg via INTRAVENOUS

## 2023-02-13 MED ORDER — BUPIVACAINE-EPINEPHRINE 0.25% -1:200000 IJ SOLN
INTRAMUSCULAR | Status: AC
  Filled 2023-02-13: qty 1

## 2023-02-13 MED ORDER — BUPIVACAINE LIPOSOME 1.3 % IJ SUSP: 20.0000 mL | Freq: Once | INTRAMUSCULAR | Status: DC

## 2023-02-13 MED ORDER — METHOCARBAMOL 750 MG PO TABS: 750.0000 mg | ORAL_TABLET | Freq: Three times a day (TID) | ORAL | 0 refills | Status: AC | PRN

## 2023-02-13 MED ORDER — CLONIDINE HCL (ANALGESIA) 100 MCG/ML EP SOLN
EPIDURAL | Status: DC | PRN
  Administered 2023-02-13: 50 ug

## 2023-02-13 MED ORDER — PHENYLEPHRINE 80 MCG/ML (10ML) SYRINGE FOR IV PUSH (FOR BLOOD PRESSURE SUPPORT)
PREFILLED_SYRINGE | INTRAVENOUS | Status: DC | PRN
  Administered 2023-02-13: 80 ug via INTRAVENOUS
  Administered 2023-02-13: 160 ug via INTRAVENOUS

## 2023-02-13 MED ORDER — LACTATED RINGERS IV SOLN: INTRAVENOUS | Status: DC

## 2023-02-13 MED ORDER — BUPIVACAINE IN DEXTROSE 0.75-8.25 % IT SOLN
INTRATHECAL | Status: DC | PRN
  Administered 2023-02-13: 1.6 mL via INTRATHECAL

## 2023-02-13 MED ORDER — ONDANSETRON 4 MG PO TBDP: 4.0000 mg | ORAL_TABLET | Freq: Three times a day (TID) | ORAL | 0 refills | Status: AC | PRN

## 2023-02-13 MED ORDER — ORAL CARE MOUTH RINSE: 15.0000 mL | Freq: Once | OROMUCOSAL | Status: AC

## 2023-02-13 SURGICAL SUPPLY — 54 items
BAG COUNTER SPONGE SURGICOUNT (BAG) IMPLANT
BAG SPNG CNTER NS LX DISP (BAG) ×1
BLADE SAG 18X100X1.27 (BLADE) ×1 IMPLANT
BLADE SAGITTAL 25.0X1.37X90 (BLADE) ×1 IMPLANT
BLADE SURG 15 STRL LF DISP TIS (BLADE) ×1 IMPLANT
BLADE SURG 15 STRL SS (BLADE) ×1
BNDG CMPR MED 10X6 ELC LF (GAUZE/BANDAGES/DRESSINGS) ×1
BNDG ELASTIC 6X10 VLCR STRL LF (GAUZE/BANDAGES/DRESSINGS) ×1 IMPLANT
BOWL SMART MIX CTS (DISPOSABLE) IMPLANT
BSPLAT TIB 4 KN TRITANIUM (Knees) ×1 IMPLANT
CLSR STERI-STRIP ANTIMIC 1/2X4 (GAUZE/BANDAGES/DRESSINGS) ×2 IMPLANT
COVER SURGICAL LIGHT HANDLE (MISCELLANEOUS) ×1 IMPLANT
CUFF TOURN SGL QUICK 34 (TOURNIQUET CUFF) ×1
CUFF TRNQT CYL 34X4.125X (TOURNIQUET CUFF) ×1 IMPLANT
DRAPE U-SHAPE 47X51 STRL (DRAPES) ×1 IMPLANT
DRSG MEPILEX POST OP 4X12 (GAUZE/BANDAGES/DRESSINGS) ×1 IMPLANT
DRSG MEPILEX POST OP 4X8 (GAUZE/BANDAGES/DRESSINGS) IMPLANT
DURAPREP 26ML APPLICATOR (WOUND CARE) ×2 IMPLANT
ELECT REM PT RETURN 15FT ADLT (MISCELLANEOUS) ×1 IMPLANT
GLOVE BIO SURGEON STRL SZ7.5 (GLOVE) ×2 IMPLANT
GLOVE BIOGEL PI IND STRL 7.5 (GLOVE) ×1 IMPLANT
GLOVE BIOGEL PI IND STRL 8 (GLOVE) ×1 IMPLANT
GLOVE SURG SYN 7.5 E (GLOVE) ×1 IMPLANT
GLOVE SURG SYN 7.5 PF PI (GLOVE) ×1 IMPLANT
GOWN STRL REUS W/ TWL LRG LVL3 (GOWN DISPOSABLE) ×1 IMPLANT
GOWN STRL REUS W/ TWL XL LVL3 (GOWN DISPOSABLE) ×1 IMPLANT
GOWN STRL REUS W/TWL LRG LVL3 (GOWN DISPOSABLE) ×1
GOWN STRL REUS W/TWL XL LVL3 (GOWN DISPOSABLE) ×1
HANDPIECE INTERPULSE COAX TIP (DISPOSABLE) ×1
HOLDER FOLEY CATH W/STRAP (MISCELLANEOUS) IMPLANT
IMMOBILIZER KNEE 20 (SOFTGOODS)
IMMOBILIZER KNEE 20 THIGH 36 (SOFTGOODS) IMPLANT
IMMOBILIZER KNEE 22 UNIV (SOFTGOODS) IMPLANT
INSERT TRIATH X3 SZ4 9 (Insert) IMPLANT
KIT TURNOVER KIT A (KITS) IMPLANT
KNEE FEMORAL COMP RT RETAIN (Knees) IMPLANT
KNEE PATELLA ASYMMETRIC 9X29 (Knees) IMPLANT
KNEE TIBIAL COMP TRI SZ4 (Knees) IMPLANT
MANIFOLD NEPTUNE II (INSTRUMENTS) ×1 IMPLANT
NS IRRIG 1000ML POUR BTL (IV SOLUTION) ×1 IMPLANT
PACK TOTAL KNEE CUSTOM (KITS) ×1 IMPLANT
PIN FLUTED HEDLESS FIX 3.5X1/8 (PIN) IMPLANT
PROTECTOR NERVE ULNAR (MISCELLANEOUS) ×1 IMPLANT
SET HNDPC FAN SPRY TIP SCT (DISPOSABLE) ×1 IMPLANT
SPIKE FLUID TRANSFER (MISCELLANEOUS) ×1 IMPLANT
SUT MNCRL AB 3-0 PS2 18 (SUTURE) ×1 IMPLANT
SUT VIC AB 0 CT1 36 (SUTURE) ×1 IMPLANT
SUT VIC AB 1 CT1 36 (SUTURE) ×1 IMPLANT
SUT VIC AB 2-0 CT1 27 (SUTURE) ×1
SUT VIC AB 2-0 CT1 TAPERPNT 27 (SUTURE) ×1 IMPLANT
TOWEL GREEN STERILE FF (TOWEL DISPOSABLE) ×1 IMPLANT
TRAY FOLEY MTR SLVR 16FR STAT (SET/KITS/TRAYS/PACK) IMPLANT
TUBE SUCTION HIGH CAP CLEAR NV (SUCTIONS) ×1 IMPLANT
WRAP KNEE MAXI GEL POST OP (GAUZE/BANDAGES/DRESSINGS) ×1 IMPLANT

## 2023-02-13 NOTE — Progress Notes (Signed)
Orthopedic Tech Progress Note Patient Details:  Christina Villegas Oct 26, 1950 [id number] Applied bone foam per order.  Ortho Devices Type of Ortho Device: Bone foam zero knee Ortho Device/Splint Location: RLE Ortho Device/Splint Interventions: Ordered, Application, Adjustment   Post Interventions Patient Tolerated: Well Instructions Provided: Adjustment of device, Care of device  Blase Mess 02/13/2023, 3:50 PM

## 2023-02-13 NOTE — Anesthesia Procedure Notes (Signed)
Anesthesia Regional Block: Adductor canal block   Pre-Anesthetic Checklist: , timeout performed,  Correct Patient, Correct Site, Correct Laterality,  Correct Procedure, Correct Position, site marked,  Risks and benefits discussed,  Surgical consent,  Pre-op evaluation,  At surgeon's request and post-op pain management  Laterality: Right  Prep: chloraprep       Needles:  Injection technique: Single-shot  Needle Type: Echogenic Needle     Needle Length: 9cm  Needle Gauge: 21     Additional Needles:   Procedures:,,,, ultrasound used (permanent image in chart),,    Narrative:  Start time: 02/13/2023 11:32 AM End time: 02/13/2023 11:37 AM Injection made incrementally with aspirations every 5 mL.  Performed by: Personally  Anesthesiologist: Marcene Duos, MD

## 2023-02-13 NOTE — Op Note (Signed)
DATE OF SURGERY:  02/13/2023 TIME: 1:48 PM  PATIENT NAME:  Christina Villegas   AGE: 72 y.o.    PRE-OPERATIVE DIAGNOSIS:  OA RIGHT KNEE  POST-OPERATIVE DIAGNOSIS:  Same  PROCEDURE:  Procedure(s): TOTAL KNEE ARTHROPLASTY   SURGEON:  Sheral Apley, MD   ASSISTANT:  Levester Fresh, PA-C, she was present and scrubbed throughout the case, critical for completion in a timely fashion, and for retraction, instrumentation, and closure.    OPERATIVE IMPLANTS: [employer] Triathlon CR. Press fit knee  Femur size 4, Tibia size 4, Patella size 29 3-peg oval button, with a 9 mm polyethylene insert.   PREOPERATIVE INDICATIONS:  Christina Villegas is a 72 y.o. year old female with end stage bone on bone degenerative arthritis of the knee who failed conservative treatment, including injections, antiinflammatories, activity modification, and assistive devices, and had significant impairment of their activities of daily living, and elected for Total Knee Arthroplasty.   The risks, benefits, and alternatives were discussed at length including but not limited to the risks of infection, bleeding, nerve injury, stiffness, blood clots, the need for revision surgery, cardiopulmonary complications, among others, and they were willing to proceed.   OPERATIVE DESCRIPTION:  The patient was brought to the operative room and placed in a supine position.  General anesthesia was administered.  IV antibiotics were given.  The lower extremity was prepped and draped in the usual sterile fashion.  Time out was performed.  The leg was elevated and exsanguinated and the tourniquet was inflated.  Anterior approach was performed.  The patella was everted and osteophytes were removed.  The anterior horn of the medial and lateral meniscus was removed.   The distal femur was opened with the drill and the intramedullary distal femoral cutting jig was utilized, set at 5 degrees resecting 10 mm off the distal femur.  Care was taken to  protect the collateral ligaments.  The distal femoral sizing jig was applied, taking care to avoid notching.  Then the 4-in-1 cutting jig was applied and the anterior and posterior femur was cut, along with the chamfer cuts.  All posterior osteophytes were removed.  The flexion gap was then measured and was symmetric with the extension gap.  Then the extramedullary tibial cutting jig was utilized making the appropriate cut using the anterior tibial crest as a reference building in appropriate posterior slope.  Care was taken during the cut to protect the medial and collateral ligaments.  The proximal tibia was removed along with the posterior horns of the menisci.    I completed the distal femoral preparation using the appropriate jig to prepare the box.  The patella was then measured, and cut with the saw.    The proximal tibia sized and prepared accordingly with the reamer and the punch, and then all components were trialed with the above sized poly insert.  The knee was found to have excellent balance and full motion.    The above named components were then impacted into place and Poly tibial piece and patella were inserted.  I was very happy with his stability and ROM  I performed a periarticular injection with Exparel  The knee was easily taken through a range of motion and the patella tracked well and the knee irrigated copiously and the parapatellar and subcutaneous tissue closed with vicryl, and monocryl with steri strips for the skin.  The incision was dressed with sterile gauze and the tourniquet released and the patient was awakened and returned to the PACU in  stable and satisfactory condition.  There were no complications.  Total tourniquet time was roughly 75 minutes.   POSTOPERATIVE PLAN: post op Abx, DVT px: SCD's, TED's, Early ambulation and chemical px

## 2023-02-13 NOTE — Anesthesia Postprocedure Evaluation (Signed)
Anesthesia Post Note  Patient: Mychala Devoid  Procedure(s) Performed: TOTAL KNEE ARTHROPLASTY (Right: Knee)     Patient location during evaluation: PACU Anesthesia Type: MAC and Spinal Level of consciousness: awake and alert and oriented Pain management: pain level controlled Vital Signs Assessment: post-procedure vital signs reviewed and stable Respiratory status: spontaneous breathing, nonlabored ventilation and respiratory function stable Cardiovascular status: blood pressure returned to baseline and stable Postop Assessment: no headache, no backache, spinal receding and patient able to bend at knees Anesthetic complications: no   No notable events documented.  Last Vitals:  Vitals:   02/13/23 1545 02/13/23 1600  BP: (!) 150/58 128/72  Pulse: (!) 56 (!) 55  Resp: 16 19  Temp:    SpO2: 93% 92%    Last Pain:  Vitals:   02/13/23 1600  TempSrc:   PainSc: 0-No pain    LLE Motor Response: Purposeful movement (02/13/23 1600)   RLE Motor Response: Purposeful movement (02/13/23 1600)   L Sensory Level: L4-Anterior knee, lower leg (02/13/23 1600) R Sensory Level: L3-Anterior knee, lower leg (02/13/23 1600)  Lannie Fields

## 2023-02-13 NOTE — Interval H&P Note (Signed)
History and Physical Interval Note:  02/13/2023 11:31 AM  Christina Villegas  has presented today for surgery, with the diagnosis of OA RIGHT KNEE.  The various methods of treatment have been discussed with the patient and family. After consideration of risks, benefits and other options for treatment, the patient has consented to  Procedure(s): TOTAL KNEE ARTHROPLASTY (Right) as a surgical intervention.  The patient's history has been reviewed, patient examined, no change in status, stable for surgery.  I have reviewed the patient's chart and labs.  Questions were answered to the patient's satisfaction.     Sheral Apley

## 2023-02-13 NOTE — Interval H&P Note (Signed)
History and Physical Interval Note:  02/13/2023 11:30 AM  Christina Villegas  has presented today for surgery, with the diagnosis of OA RIGHT KNEE.  The various methods of treatment have been discussed with the patient and family. After consideration of risks, benefits and other options for treatment, the patient has consented to  Procedure(s): TOTAL KNEE ARTHROPLASTY (Right) as a surgical intervention.  The patient's history has been reviewed, patient examined, no change in status, stable for surgery.  I have reviewed the patient's chart and labs.  Questions were answered to the patient's satisfaction.     Sheral Apley

## 2023-02-13 NOTE — Anesthesia Procedure Notes (Signed)
Procedure Name: MAC Date/Time: 02/13/2023 12:40 PM  Performed by: Briant Sites, CRNAPre-anesthesia Checklist: Patient identified, Emergency Drugs available, Suction available, Patient being monitored and Timeout performed Patient Re-evaluated:Patient Re-evaluated prior to induction Oxygen Delivery Method: Simple face mask Placement Confirmation: positive ETCO2

## 2023-02-13 NOTE — Discharge Instructions (Addendum)

## 2023-02-13 NOTE — Transfer of Care (Signed)
Immediate Anesthesia Transfer of Care Note  Patient: Christina Villegas  Procedure(s) Performed: TOTAL KNEE ARTHROPLASTY (Right: Knee)  Patient Location: PACU  Anesthesia Type:Spinal  Level of Consciousness: drowsy  Airway & Oxygen Therapy: Patient Spontanous Breathing and Patient connected to face mask oxygen  Post-op Assessment: Report given to RN  Post vital signs: Reviewed and stable  Last Vitals:  Vitals Value Taken Time  BP 133/50 02/13/23 1435  Temp    Pulse 56 02/13/23 1436  Resp 15 02/13/23 1437  SpO2 100% 02/13/23 1436  Vitals shown include unfiled device data.  Last Pain:  Vitals:   02/13/23 1038  TempSrc: Oral  PainSc:       Patients Stated Pain Goal: 5 (02/13/23 1007)  Complications: No notable events documented.

## 2023-02-13 NOTE — Evaluation (Addendum)
Physical Therapy Evaluation Patient Details Name: Christina Villegas MRN: [id number] DOB: Apr 11, 1951 Today's Date: 02/13/2023  History of Present Illness  72 yo female presents to therapy s/p R TKA on 02/13/2023 due to failure of conservative measures. Pt PMH includes but is not limited to: HLD, HTN, and L wrist surgery.  Clinical Impression    Christina Villegas is a 72 y.o. female POD 0 s/p R TKA. Patient reports IND with mobility at baseline. Patient is now limited by functional impairments (see PT problem list below) and requires min guard for bed mobility and transfers and gait with RW. Patient was able to ambulate 50 and 25 feet with RW and min guard and cues for safe walker management. Patient educated on safe sequencing for stair mobility with B handrails and with RW, pain management and pain goal, use of CP/ice pt reports iceman machine in home setting, fall risk prevention, car transfer pt and spouse verbalized understanding of safe guarding position for people assisting with mobility. Patient instructed in exercises to facilitate ROM and circulation reviewed with HO provided. Patient will benefit from continued skilled PT interventions to address impairments and progress towards PLOF. Patient has met mobility goals at adequate level for discharge home with family support and OPPT services; will continue to follow if pt continues acute stay to progress towards Mod I goals.       If plan is discharge home, recommend the following: A little help with walking and/or transfers;A little help with bathing/dressing/bathroom;Assistance with cooking/housework;Assist for transportation;Help with stairs or ramp for entrance   Can travel by private vehicle        Equipment Recommendations Rolling walker (2 wheels) (provided and adjusted at eval)  Recommendations for Other Services       Functional Status Assessment Patient has had a recent decline in their functional status and demonstrates the ability to  make significant improvements in function in a reasonable and predictable amount of time.     Precautions / Restrictions Precautions Precautions: Knee;Fall Restrictions Weight Bearing Restrictions: No      Mobility  Bed Mobility Overal bed mobility: Needs Assistance Bed Mobility: Supine to Sit     Supine to sit: Min guard, HOB elevated     General bed mobility comments: min cues and encouragement for IND    Transfers Overall transfer level: Needs assistance Equipment used: Rolling walker (2 wheels) Transfers: Sit to/from Stand Sit to Stand: Min guard           General transfer comment: cues for proper UE and AD placement with bed, recliner and commode transfers    Ambulation/Gait Ambulation/Gait assistance: Min guard Gait Distance (Feet): 50 Feet Assistive device: Rolling walker (2 wheels) Gait Pattern/deviations: Step-to pattern, Antalgic, Trunk flexed Gait velocity: decreased     General Gait Details: mild R LE instabiltiy noted initally and as pt progressed with gait improved and no longer evident.  Stairs Stairs: Yes Stairs assistance: Min guard Stair Management: Two rails Number of Stairs: 2 General stair comments: pt ed provided on step navigation wtih B handrails and pt and spouse ed provided on step navigation wtih use of RW to enter home with one step  Wheelchair Mobility     Tilt Bed    Modified Rankin (Stroke Patients Only)       Balance Overall balance assessment: Needs assistance, History of Falls (one fall) Sitting-balance support: Feet unsupported, Bilateral upper extremity supported Sitting balance-Leahy Scale: Fair     Standing balance support: Bilateral upper extremity supported, During  functional activity, Reliant on assistive device for balance Standing balance-Leahy Scale: Fair Standing balance comment: pt demonstrated mild instabiltiy with one turn, pt indicated PLOF dizziness with head turns, pt is able to maintain static  standing balance without UE support with min guard                             Pertinent Vitals/Pain Pain Assessment Pain Assessment: 0-10 Pain Score: 3  Pain Location: R knee Pain Descriptors / Indicators: Aching, Burning, Operative site guarding Pain Intervention(s): Monitored during session, Limited activity within patient's tolerance, Premedicated before session, Repositioned, Ice applied    Home Living Family/patient expects to be discharged to:: Private residence Living Arrangements: Spouse/significant other Available Help at Discharge: Family Type of Home: House Home Access: Stairs to enter Entrance Stairs-Rails: None Secretary/administrator of Steps: 1   Home Layout: One level Home Equipment:  (reports walker but pt and spouse unclear if it is a rolling walker)      Prior Function Prior Level of Function : Needs assist       Physical Assist : ADLs (physical)   ADLs (physical): Dressing Mobility Comments: spouse assisted with Lower body dressing ADLs Comments: IND for ambulation     Hand Dominance        Extremity/Trunk Assessment        Lower Extremity Assessment Lower Extremity Assessment: RLE deficits/detail RLE Deficits / Details: ankle DF/PF 5/5; SLR < 10 degree lag RLE Sensation: decreased light touch (pt report some residual numbness on R heel)    Cervical / Trunk Assessment Cervical / Trunk Assessment:  (wfl)  Communication   Communication: No difficulties  Cognition Arousal/Alertness: Awake/alert Behavior During Therapy: WFL for tasks assessed/performed Overall Cognitive Status: Within Functional Limits for tasks assessed                                          General Comments      Exercises Total Joint Exercises Ankle Circles/Pumps: AROM, Both, 20 reps Quad Sets: AROM, Right, 5 reps Short Arc Quad: AROM, Right, 5 reps Heel Slides: AROM, Right, 5 reps Hip ABduction/ADduction: AROM, Right, 5  reps Straight Leg Raises: AROM, Right, 5 reps Knee Flexion: AROM, Right, 5 reps, Seated   Assessment/Plan    PT Assessment Patient needs continued PT services  PT Problem List Decreased strength;Decreased range of motion;Decreased activity tolerance;Decreased mobility;Decreased balance;Decreased coordination;Decreased cognition;Pain       PT Treatment Interventions DME instruction;Gait training;Stair training;Functional mobility training;Therapeutic activities;Therapeutic exercise;Balance training;Neuromuscular re-education;Patient/family education;Modalities    PT Goals (Current goals can be found in the Care Plan section)  Acute Rehab PT Goals Patient Stated Goal: to be able to get back on the bike and ride PT Goal Formulation: With patient Time For Goal Achievement: 03/06/23 Potential to Achieve Goals: Good    Frequency 7X/week     Co-evaluation               AM-PAC PT "6 Clicks" Mobility  Outcome Measure Help needed turning from your back to your side while in a flat bed without using bedrails?: A Little Help needed moving from lying on your back to sitting on the side of a flat bed without using bedrails?: A Little Help needed moving to and from a bed to a chair (including a wheelchair)?: A Little Help needed standing up from  a chair using your arms (e.g., wheelchair or bedside chair)?: A Little Help needed to walk in hospital room?: A Little Help needed climbing 3-5 steps with a railing? : A Little 6 Click Score: 18    End of Session Equipment Utilized During Treatment: Gait belt Activity Tolerance: Patient tolerated treatment well Patient left: in chair;with call bell/phone within reach;with family/visitor present Nurse Communication: Mobility status;Other (comment) (pt readiness for d/c) PT Visit Diagnosis: Unsteadiness on feet (R26.81);Other abnormalities of gait and mobility (R26.89);Muscle weakness (generalized) (M62.81);Pain;Difficulty in walking, not  elsewhere classified (R26.2);History of falling (Z91.81) Pain - Right/Left: Right Pain - part of body: Knee    Time: 9528-4132 PT Time Calculation (min) (ACUTE ONLY): 45 min   Charges:   PT Evaluation $PT Eval Low Complexity: 1 Low PT Treatments $Gait Training: 8-22 mins $Therapeutic Exercise: 8-22 mins PT General Charges $$ ACUTE PT VISIT: 1 Visit         Johnny Bridge, PT Acute Rehab   Jacqualyn Posey 02/13/2023, 7:46 PM

## 2023-02-13 NOTE — Anesthesia Procedure Notes (Signed)
Spinal  Patient location during procedure: OR Start time: 02/13/2023 12:31 PM End time: 02/13/2023 12:36 PM Reason for block: surgical anesthesia Staffing Performed: anesthesiologist  Anesthesiologist: Marcene Duos, MD Performed by: Marcene Duos, MD Authorized by: Marcene Duos, MD   Preanesthetic Checklist Completed: patient identified, IV checked, site marked, risks and benefits discussed, surgical consent, monitors and equipment checked, pre-op evaluation and timeout performed Spinal Block Patient position: sitting Prep: DuraPrep Patient monitoring: heart rate, cardiac monitor, continuous pulse ox and blood pressure Approach: midline Location: L4-5 Injection technique: single-shot Needle Needle type: Pencan  Needle gauge: 24 G Needle length: 9 cm Assessment Sensory level: T4 Events: CSF return

## 2023-02-13 NOTE — Anesthesia Preprocedure Evaluation (Signed)
Anesthesia Evaluation  Patient identified by MRN, date of birth, ID band Patient awake    Reviewed: Allergy & Precautions, NPO status , Patient's Chart, lab work & pertinent test results  Airway Mallampati: II  TM Distance: >3 FB Neck ROM: Full    Dental  (+) Dental Advisory Given   Pulmonary former smoker   breath sounds clear to auscultation       Cardiovascular hypertension, Pt. on medications  Rhythm:Regular Rate:Normal     Neuro/Psych negative neurological ROS     GI/Hepatic negative GI ROS, Neg liver ROS,,,  Endo/Other  negative endocrine ROS    Renal/GU negative Renal ROS     Musculoskeletal  (+) Arthritis ,    Abdominal   Peds  Hematology negative hematology ROS (+)   Anesthesia Other Findings   Reproductive/Obstetrics                             Anesthesia Physical Anesthesia Plan  ASA: 2  Anesthesia Plan: Spinal and MAC   Post-op Pain Management: Regional block* and Tylenol PO (pre-op)*   Induction:   PONV Risk Score and Plan: 2 and Propofol infusion, Dexamethasone and Ondansetron  Airway Management Planned: Natural Airway and Simple Face Mask  Additional Equipment:   Intra-op Plan:   Post-operative Plan:   Informed Consent: I have reviewed the patients History and Physical, chart, labs and discussed the procedure including the risks, benefits and alternatives for the proposed anesthesia with the patient or authorized representative who has indicated his/her understanding and acceptance.       Plan Discussed with: CRNA  Anesthesia Plan Comments:        Anesthesia Quick Evaluation

## 2023-02-14 ENCOUNTER — Encounter (HOSPITAL_COMMUNITY): Payer: Self-pay | Admitting: Orthopedic Surgery

## 2023-02-19 DIAGNOSIS — M25561 Pain in right knee: Secondary | ICD-10-CM | POA: Diagnosis not present

## 2023-02-21 DIAGNOSIS — Z96651 Presence of right artificial knee joint: Secondary | ICD-10-CM | POA: Diagnosis not present

## 2023-02-21 DIAGNOSIS — M25561 Pain in right knee: Secondary | ICD-10-CM | POA: Diagnosis not present

## 2023-02-21 DIAGNOSIS — M1711 Unilateral primary osteoarthritis, right knee: Secondary | ICD-10-CM | POA: Diagnosis not present

## 2023-02-27 DIAGNOSIS — M1711 Unilateral primary osteoarthritis, right knee: Secondary | ICD-10-CM | POA: Diagnosis not present

## 2023-02-27 DIAGNOSIS — Z96651 Presence of right artificial knee joint: Secondary | ICD-10-CM | POA: Diagnosis not present

## 2023-02-27 DIAGNOSIS — M25561 Pain in right knee: Secondary | ICD-10-CM | POA: Diagnosis not present

## 2023-02-28 DIAGNOSIS — M1711 Unilateral primary osteoarthritis, right knee: Secondary | ICD-10-CM | POA: Diagnosis not present

## 2023-03-01 DIAGNOSIS — M1711 Unilateral primary osteoarthritis, right knee: Secondary | ICD-10-CM | POA: Diagnosis not present

## 2023-03-01 DIAGNOSIS — M25561 Pain in right knee: Secondary | ICD-10-CM | POA: Diagnosis not present

## 2023-03-01 DIAGNOSIS — Z96651 Presence of right artificial knee joint: Secondary | ICD-10-CM | POA: Diagnosis not present

## 2023-03-05 DIAGNOSIS — M1711 Unilateral primary osteoarthritis, right knee: Secondary | ICD-10-CM | POA: Diagnosis not present

## 2023-03-05 DIAGNOSIS — Z96651 Presence of right artificial knee joint: Secondary | ICD-10-CM | POA: Diagnosis not present

## 2023-03-05 DIAGNOSIS — M25561 Pain in right knee: Secondary | ICD-10-CM | POA: Diagnosis not present

## 2023-03-08 DIAGNOSIS — Z96651 Presence of right artificial knee joint: Secondary | ICD-10-CM | POA: Diagnosis not present

## 2023-03-08 DIAGNOSIS — M25561 Pain in right knee: Secondary | ICD-10-CM | POA: Diagnosis not present

## 2023-03-08 DIAGNOSIS — M1711 Unilateral primary osteoarthritis, right knee: Secondary | ICD-10-CM | POA: Diagnosis not present

## 2023-03-12 DIAGNOSIS — Z96651 Presence of right artificial knee joint: Secondary | ICD-10-CM | POA: Diagnosis not present

## 2023-03-12 DIAGNOSIS — M1711 Unilateral primary osteoarthritis, right knee: Secondary | ICD-10-CM | POA: Diagnosis not present

## 2023-03-12 DIAGNOSIS — M25561 Pain in right knee: Secondary | ICD-10-CM | POA: Diagnosis not present

## 2023-03-14 DIAGNOSIS — M1711 Unilateral primary osteoarthritis, right knee: Secondary | ICD-10-CM | POA: Diagnosis not present

## 2023-03-15 DIAGNOSIS — Z96651 Presence of right artificial knee joint: Secondary | ICD-10-CM | POA: Diagnosis not present

## 2023-03-15 DIAGNOSIS — M1711 Unilateral primary osteoarthritis, right knee: Secondary | ICD-10-CM | POA: Diagnosis not present

## 2023-03-15 DIAGNOSIS — M25561 Pain in right knee: Secondary | ICD-10-CM | POA: Diagnosis not present

## 2023-03-20 DIAGNOSIS — F418 Other specified anxiety disorders: Secondary | ICD-10-CM | POA: Diagnosis not present

## 2023-03-20 DIAGNOSIS — I10 Essential (primary) hypertension: Secondary | ICD-10-CM | POA: Diagnosis not present

## 2023-03-20 DIAGNOSIS — R7303 Prediabetes: Secondary | ICD-10-CM | POA: Diagnosis not present

## 2023-03-20 DIAGNOSIS — E039 Hypothyroidism, unspecified: Secondary | ICD-10-CM | POA: Diagnosis not present

## 2023-03-20 DIAGNOSIS — E559 Vitamin D deficiency, unspecified: Secondary | ICD-10-CM | POA: Diagnosis not present

## 2023-03-20 DIAGNOSIS — E785 Hyperlipidemia, unspecified: Secondary | ICD-10-CM | POA: Diagnosis not present

## 2023-03-20 DIAGNOSIS — Z1231 Encounter for screening mammogram for malignant neoplasm of breast: Secondary | ICD-10-CM | POA: Diagnosis not present

## 2023-03-20 DIAGNOSIS — E2839 Other primary ovarian failure: Secondary | ICD-10-CM | POA: Diagnosis not present

## 2023-03-21 DIAGNOSIS — M25561 Pain in right knee: Secondary | ICD-10-CM | POA: Diagnosis not present

## 2023-03-21 DIAGNOSIS — Z96651 Presence of right artificial knee joint: Secondary | ICD-10-CM | POA: Diagnosis not present

## 2023-03-21 DIAGNOSIS — M1711 Unilateral primary osteoarthritis, right knee: Secondary | ICD-10-CM | POA: Diagnosis not present

## 2023-03-23 DIAGNOSIS — M1711 Unilateral primary osteoarthritis, right knee: Secondary | ICD-10-CM | POA: Diagnosis not present

## 2023-03-23 DIAGNOSIS — Z96651 Presence of right artificial knee joint: Secondary | ICD-10-CM | POA: Diagnosis not present

## 2023-03-23 DIAGNOSIS — M25561 Pain in right knee: Secondary | ICD-10-CM | POA: Diagnosis not present

## 2023-03-27 DIAGNOSIS — M25561 Pain in right knee: Secondary | ICD-10-CM | POA: Diagnosis not present

## 2023-03-27 DIAGNOSIS — M1711 Unilateral primary osteoarthritis, right knee: Secondary | ICD-10-CM | POA: Diagnosis not present

## 2023-03-27 DIAGNOSIS — Z96651 Presence of right artificial knee joint: Secondary | ICD-10-CM | POA: Diagnosis not present

## 2023-03-29 DIAGNOSIS — M25561 Pain in right knee: Secondary | ICD-10-CM | POA: Diagnosis not present

## 2023-03-29 DIAGNOSIS — Z96651 Presence of right artificial knee joint: Secondary | ICD-10-CM | POA: Diagnosis not present

## 2023-03-29 DIAGNOSIS — M1711 Unilateral primary osteoarthritis, right knee: Secondary | ICD-10-CM | POA: Diagnosis not present

## 2023-04-02 DIAGNOSIS — M1711 Unilateral primary osteoarthritis, right knee: Secondary | ICD-10-CM | POA: Diagnosis not present

## 2023-04-02 DIAGNOSIS — Z96651 Presence of right artificial knee joint: Secondary | ICD-10-CM | POA: Diagnosis not present

## 2023-04-02 DIAGNOSIS — M25561 Pain in right knee: Secondary | ICD-10-CM | POA: Diagnosis not present

## 2023-04-05 DIAGNOSIS — M25561 Pain in right knee: Secondary | ICD-10-CM | POA: Diagnosis not present

## 2023-04-05 DIAGNOSIS — Z96651 Presence of right artificial knee joint: Secondary | ICD-10-CM | POA: Diagnosis not present

## 2023-04-05 DIAGNOSIS — M1711 Unilateral primary osteoarthritis, right knee: Secondary | ICD-10-CM | POA: Diagnosis not present

## 2023-04-10 DIAGNOSIS — M1711 Unilateral primary osteoarthritis, right knee: Secondary | ICD-10-CM | POA: Diagnosis not present

## 2023-04-10 DIAGNOSIS — M25561 Pain in right knee: Secondary | ICD-10-CM | POA: Diagnosis not present

## 2023-04-10 DIAGNOSIS — Z96651 Presence of right artificial knee joint: Secondary | ICD-10-CM | POA: Diagnosis not present

## 2023-04-11 DIAGNOSIS — M1712 Unilateral primary osteoarthritis, left knee: Secondary | ICD-10-CM | POA: Diagnosis not present

## 2023-06-11 NOTE — Progress Notes (Unsigned)
Office Visit Note  Patient: Christina Villegas             Date of Birth: 1951-01-03           MRN: [id number]             PCP: Ailene Ravel, MD Referring: Ailene Ravel, MD Visit Date: 06/25/2023 Occupation: @GUAROCC @  Subjective:  No chief complaint on file.   History of Present Illness: Christina Villegas is a 72 y.o. female ***     Activities of Daily Living:  Patient reports morning stiffness for *** {minute/hour:19697}.   Patient {ACTIONS;DENIES/REPORTS:21021675::"Denies"} nocturnal pain.  Difficulty dressing/grooming: {ACTIONS;DENIES/REPORTS:21021675::"Denies"} Difficulty climbing stairs: {ACTIONS;DENIES/REPORTS:21021675::"Denies"} Difficulty getting out of chair: {ACTIONS;DENIES/REPORTS:21021675::"Denies"} Difficulty using hands for taps, buttons, cutlery, and/or writing: {ACTIONS;DENIES/REPORTS:21021675::"Denies"}  No Rheumatology ROS completed.   PMFS History:  Patient Active Problem List   Diagnosis Date Noted   S/P total knee arthroplasty, right 02/13/2023   Syncope 05/21/2013   EKG abnormalities 05/21/2013   Hypertension 05/21/2013    Past Medical History:  Diagnosis Date   Anxiety    Arthritis    Complication of anesthesia    dizziness   Depression    Dyspnea    Heart murmur    High cholesterol    Hypertension    Palpitations    Pre-diabetes     Family History  Problem Relation Age of Onset   Heart failure Mother    Hypertension Mother    Heart failure Father    Hypertension Father    Heart attack Father    Diabetes Sister    Heart disease Sister    Healthy Son    Healthy Son    Past Surgical History:  Procedure Laterality Date   ABLATION Right    vein ablation- right leg   CATARACT EXTRACTION Bilateral    KNEE ARTHROSCOPY     rt side   TOTAL KNEE ARTHROPLASTY Right 02/13/2023   Procedure: TOTAL KNEE ARTHROPLASTY;  Surgeon: Sheral Apley, MD;  Location: [facility];  Service: Orthopedics;  Laterality: Right;   WRIST SURGERY Left  05/2022   Social History   Social History Narrative   Not on file    There is no immunization history on file for this patient.   Objective: Vital Signs: There were no vitals taken for this visit.   Physical Exam   Musculoskeletal Exam: ***  CDAI Exam: CDAI Score: -- Patient Global: --; Provider Global: -- Swollen: --; Tender: -- Joint Exam 06/25/2023   No joint exam has been documented for this visit   There is currently no information documented on the homunculus. Go to the Rheumatology activity and complete the homunculus joint exam.  Investigation: No additional findings.  Imaging: No results found.  Recent Labs: Lab Results  Component Value Date   WBC 8.2 01/28/2023   HGB 12.7 01/28/2023   PLT 162 01/28/2023   NA 138 02/13/2023   K 4.1 02/13/2023   CL 102 02/13/2023   CO2 26 02/13/2023   GLUCOSE 104 (H) 02/13/2023   BUN 18 02/13/2023   CREATININE 0.73 02/13/2023   BILITOT 0.4 05/22/2013   ALKPHOS 69 05/22/2013   AST 17 05/22/2013   ALT 14 05/22/2013   PROT 6.0 05/22/2013   ALBUMIN 3.5 05/22/2013   CALCIUM 9.2 02/13/2023   GFRAA >90 05/22/2013    Speciality Comments: No specialty comments available.  Procedures:  No procedures performed Allergies: Hydrocodone   Assessment / Plan:     Visit Diagnoses: No diagnosis  found.  Orders: No orders of the defined types were placed in this encounter.  No orders of the defined types were placed in this encounter.   Face-to-face time spent with patient was *** minutes. Greater than 50% of time was spent in counseling and coordination of care.  Follow-Up Instructions: No follow-ups on file.   Pollyann Savoy, MD  Note - This record has been created using Animal nutritionist.  Chart creation errors have been sought, but may not always  have been located. Such creation errors do not reflect on  the standard of medical care.

## 2023-06-25 ENCOUNTER — Ambulatory Visit: Payer: No Typology Code available for payment source | Admitting: Rheumatology

## 2023-06-25 DIAGNOSIS — M5134 Other intervertebral disc degeneration, thoracic region: Secondary | ICD-10-CM

## 2023-06-25 DIAGNOSIS — F419 Anxiety disorder, unspecified: Secondary | ICD-10-CM

## 2023-06-25 DIAGNOSIS — M545 Low back pain, unspecified: Secondary | ICD-10-CM

## 2023-06-25 DIAGNOSIS — E559 Vitamin D deficiency, unspecified: Secondary | ICD-10-CM

## 2023-06-25 DIAGNOSIS — M5030 Other cervical disc degeneration, unspecified cervical region: Secondary | ICD-10-CM

## 2023-06-25 DIAGNOSIS — E7800 Pure hypercholesterolemia, unspecified: Secondary | ICD-10-CM

## 2023-06-25 DIAGNOSIS — M170 Bilateral primary osteoarthritis of knee: Secondary | ICD-10-CM

## 2023-06-25 DIAGNOSIS — I10 Essential (primary) hypertension: Secondary | ICD-10-CM

## 2023-06-25 DIAGNOSIS — M19042 Primary osteoarthritis, left hand: Secondary | ICD-10-CM

## 2023-06-25 DIAGNOSIS — R202 Paresthesia of skin: Secondary | ICD-10-CM

## 2023-06-25 DIAGNOSIS — Z87891 Personal history of nicotine dependence: Secondary | ICD-10-CM

## 2023-09-05 DIAGNOSIS — R051 Acute cough: Secondary | ICD-10-CM | POA: Diagnosis not present

## 2023-09-05 DIAGNOSIS — R030 Elevated blood-pressure reading, without diagnosis of hypertension: Secondary | ICD-10-CM | POA: Diagnosis not present

## 2023-09-05 DIAGNOSIS — R0981 Nasal congestion: Secondary | ICD-10-CM | POA: Diagnosis not present

## 2023-09-05 DIAGNOSIS — J101 Influenza due to other identified influenza virus with other respiratory manifestations: Secondary | ICD-10-CM | POA: Diagnosis not present

## 2023-09-05 DIAGNOSIS — R509 Fever, unspecified: Secondary | ICD-10-CM | POA: Diagnosis not present

## 2023-10-05 DIAGNOSIS — E559 Vitamin D deficiency, unspecified: Secondary | ICD-10-CM | POA: Diagnosis not present

## 2023-10-05 DIAGNOSIS — R7303 Prediabetes: Secondary | ICD-10-CM | POA: Diagnosis not present

## 2023-10-05 DIAGNOSIS — I10 Essential (primary) hypertension: Secondary | ICD-10-CM | POA: Diagnosis not present

## 2023-10-05 DIAGNOSIS — E039 Hypothyroidism, unspecified: Secondary | ICD-10-CM | POA: Diagnosis not present

## 2023-10-05 DIAGNOSIS — E785 Hyperlipidemia, unspecified: Secondary | ICD-10-CM | POA: Diagnosis not present

## 2023-10-05 DIAGNOSIS — Z23 Encounter for immunization: Secondary | ICD-10-CM | POA: Diagnosis not present

## 2023-10-05 DIAGNOSIS — R52 Pain, unspecified: Secondary | ICD-10-CM | POA: Diagnosis not present

## 2023-10-05 DIAGNOSIS — F418 Other specified anxiety disorders: Secondary | ICD-10-CM | POA: Diagnosis not present

## 2023-10-05 DIAGNOSIS — Z1159 Encounter for screening for other viral diseases: Secondary | ICD-10-CM | POA: Diagnosis not present

## 2024-01-25 DIAGNOSIS — M1712 Unilateral primary osteoarthritis, left knee: Secondary | ICD-10-CM | POA: Diagnosis not present

## 2024-01-25 DIAGNOSIS — M542 Cervicalgia: Secondary | ICD-10-CM | POA: Diagnosis not present

## 2024-01-25 DIAGNOSIS — M1711 Unilateral primary osteoarthritis, right knee: Secondary | ICD-10-CM | POA: Diagnosis not present

## 2024-01-25 DIAGNOSIS — M170 Bilateral primary osteoarthritis of knee: Secondary | ICD-10-CM | POA: Diagnosis not present

## 2024-02-26 DIAGNOSIS — H5203 Hypermetropia, bilateral: Secondary | ICD-10-CM | POA: Diagnosis not present

## 2024-04-10 DIAGNOSIS — E2839 Other primary ovarian failure: Secondary | ICD-10-CM | POA: Diagnosis not present

## 2024-04-10 DIAGNOSIS — H9193 Unspecified hearing loss, bilateral: Secondary | ICD-10-CM | POA: Diagnosis not present

## 2024-04-10 DIAGNOSIS — R52 Pain, unspecified: Secondary | ICD-10-CM | POA: Diagnosis not present

## 2024-04-10 DIAGNOSIS — E785 Hyperlipidemia, unspecified: Secondary | ICD-10-CM | POA: Diagnosis not present

## 2024-04-10 DIAGNOSIS — Z1231 Encounter for screening mammogram for malignant neoplasm of breast: Secondary | ICD-10-CM | POA: Diagnosis not present

## 2024-04-10 DIAGNOSIS — R7303 Prediabetes: Secondary | ICD-10-CM | POA: Diagnosis not present

## 2024-04-10 DIAGNOSIS — F418 Other specified anxiety disorders: Secondary | ICD-10-CM | POA: Diagnosis not present

## 2024-04-10 DIAGNOSIS — Z139 Encounter for screening, unspecified: Secondary | ICD-10-CM | POA: Diagnosis not present

## 2024-04-10 DIAGNOSIS — E039 Hypothyroidism, unspecified: Secondary | ICD-10-CM | POA: Diagnosis not present

## 2024-04-10 DIAGNOSIS — E559 Vitamin D deficiency, unspecified: Secondary | ICD-10-CM | POA: Diagnosis not present

## 2024-04-10 DIAGNOSIS — Z9181 History of falling: Secondary | ICD-10-CM | POA: Diagnosis not present

## 2024-04-10 DIAGNOSIS — I10 Essential (primary) hypertension: Secondary | ICD-10-CM | POA: Diagnosis not present

## 2024-05-01 DIAGNOSIS — I10 Essential (primary) hypertension: Secondary | ICD-10-CM | POA: Diagnosis not present

## 2024-05-01 DIAGNOSIS — R1022 Pelvic and perineal pain left side: Secondary | ICD-10-CM | POA: Diagnosis not present

## 2024-05-01 DIAGNOSIS — M25552 Pain in left hip: Secondary | ICD-10-CM | POA: Diagnosis not present

## 2024-05-01 DIAGNOSIS — M533 Sacrococcygeal disorders, not elsewhere classified: Secondary | ICD-10-CM | POA: Diagnosis not present
